# Patient Record
Sex: Female | Born: 1965 | Race: White | Hispanic: No | Smoking: Never smoker
Health system: Southern US, Community
[De-identification: ages and names within clinical notes are randomized; demographics above are authoritative.]

## PROBLEM LIST (undated history)

## (undated) DIAGNOSIS — L309 Dermatitis, unspecified: Secondary | ICD-10-CM

## (undated) DIAGNOSIS — F419 Anxiety disorder, unspecified: Secondary | ICD-10-CM

## (undated) DIAGNOSIS — E119 Type 2 diabetes mellitus without complications: Secondary | ICD-10-CM

## (undated) DIAGNOSIS — IMO0002 Reserved for concepts with insufficient information to code with codable children: Secondary | ICD-10-CM

## (undated) DIAGNOSIS — M797 Fibromyalgia: Secondary | ICD-10-CM

## (undated) DIAGNOSIS — M1990 Unspecified osteoarthritis, unspecified site: Secondary | ICD-10-CM

## (undated) DIAGNOSIS — M719 Bursopathy, unspecified: Secondary | ICD-10-CM

## (undated) DIAGNOSIS — F319 Bipolar disorder, unspecified: Secondary | ICD-10-CM

## (undated) DIAGNOSIS — G8929 Other chronic pain: Secondary | ICD-10-CM

## (undated) DIAGNOSIS — F4310 Post-traumatic stress disorder, unspecified: Secondary | ICD-10-CM

## (undated) HISTORY — PX: CERVICAL SPINE SURGERY: SHX589

## (undated) HISTORY — PX: BACK SURGERY: SHX140

## (undated) HISTORY — DX: Dermatitis, unspecified: L30.9

## (undated) HISTORY — DX: Type 2 diabetes mellitus without complications: E11.9

## (undated) HISTORY — PX: TONSILLECTOMY: SUR1361

## (undated) HISTORY — PX: HERNIA REPAIR: SHX51

---

## 2004-02-02 ENCOUNTER — Inpatient Hospital Stay (HOSPITAL_COMMUNITY): Admission: EM | Admit: 2004-02-02 | Discharge: 2004-02-06 | Payer: Self-pay | Admitting: Psychiatry

## 2004-06-15 ENCOUNTER — Emergency Department (HOSPITAL_COMMUNITY): Admission: EM | Admit: 2004-06-15 | Discharge: 2004-06-15 | Payer: Self-pay | Admitting: Emergency Medicine

## 2004-07-07 ENCOUNTER — Emergency Department (HOSPITAL_COMMUNITY): Admission: EM | Admit: 2004-07-07 | Discharge: 2004-07-07 | Payer: Self-pay | Admitting: Emergency Medicine

## 2008-10-18 ENCOUNTER — Encounter: Admission: RE | Admit: 2008-10-18 | Discharge: 2008-10-18 | Payer: Self-pay | Admitting: Family Medicine

## 2008-11-08 ENCOUNTER — Encounter
Admission: RE | Admit: 2008-11-08 | Discharge: 2009-02-06 | Payer: Self-pay | Admitting: Physical Medicine & Rehabilitation

## 2008-11-09 ENCOUNTER — Ambulatory Visit: Payer: Self-pay | Admitting: Physical Medicine & Rehabilitation

## 2008-12-28 ENCOUNTER — Ambulatory Visit (HOSPITAL_COMMUNITY): Admission: RE | Admit: 2008-12-28 | Discharge: 2008-12-28 | Payer: Self-pay | Admitting: General Surgery

## 2009-05-20 ENCOUNTER — Emergency Department (HOSPITAL_COMMUNITY): Admission: EM | Admit: 2009-05-20 | Discharge: 2009-05-20 | Payer: Self-pay | Admitting: Emergency Medicine

## 2009-10-09 ENCOUNTER — Ambulatory Visit: Payer: Self-pay | Admitting: Psychiatry

## 2009-10-09 ENCOUNTER — Inpatient Hospital Stay (HOSPITAL_COMMUNITY): Admission: AD | Admit: 2009-10-09 | Discharge: 2009-10-10 | Payer: Self-pay | Admitting: Psychiatry

## 2009-10-09 ENCOUNTER — Emergency Department (HOSPITAL_COMMUNITY): Admission: EM | Admit: 2009-10-09 | Discharge: 2009-10-09 | Payer: Self-pay | Admitting: Emergency Medicine

## 2010-06-18 ENCOUNTER — Emergency Department (HOSPITAL_COMMUNITY): Admission: EM | Admit: 2010-06-18 | Discharge: 2010-06-18 | Payer: Self-pay | Admitting: Emergency Medicine

## 2010-12-12 ENCOUNTER — Emergency Department (HOSPITAL_COMMUNITY)
Admission: EM | Admit: 2010-12-12 | Discharge: 2010-12-13 | Disposition: A | Discharge status: Home / Self Care | Payer: Self-pay | Attending: Emergency Medicine | Admitting: Emergency Medicine

## 2010-12-12 DIAGNOSIS — G8929 Other chronic pain: Secondary | ICD-10-CM | POA: Insufficient documentation

## 2010-12-12 DIAGNOSIS — F17200 Nicotine dependence, unspecified, uncomplicated: Secondary | ICD-10-CM | POA: Insufficient documentation

## 2010-12-12 DIAGNOSIS — I10 Essential (primary) hypertension: Secondary | ICD-10-CM | POA: Insufficient documentation

## 2010-12-12 DIAGNOSIS — M545 Low back pain, unspecified: Secondary | ICD-10-CM | POA: Insufficient documentation

## 2010-12-20 ENCOUNTER — Inpatient Hospital Stay (INDEPENDENT_AMBULATORY_CARE_PROVIDER_SITE_OTHER)
Admission: RE | Admit: 2010-12-20 | Discharge: 2010-12-20 | Disposition: A | Discharge status: Home / Self Care | Payer: Self-pay | Source: Ambulatory Visit | Attending: Family Medicine | Admitting: Family Medicine

## 2010-12-20 DIAGNOSIS — R6889 Other general symptoms and signs: Secondary | ICD-10-CM

## 2010-12-20 DIAGNOSIS — F411 Generalized anxiety disorder: Secondary | ICD-10-CM

## 2011-01-15 LAB — DIFFERENTIAL
Basophils Relative: 1 % (ref 0–1)
Neutro Abs: 11 10*3/uL — ABNORMAL HIGH (ref 1.7–7.7)
Neutrophils Relative %: 66 % (ref 43–77)

## 2011-01-15 LAB — POCT PREGNANCY, URINE: Preg Test, Ur: NEGATIVE

## 2011-01-15 LAB — BASIC METABOLIC PANEL
CO2: 26 mEq/L (ref 19–32)
Chloride: 106 mEq/L (ref 96–112)
GFR calc Af Amer: >60 mL/min (ref >60)
Glucose, Bld: 98 mg/dL (ref 70–99)
Potassium: 3.6 mEq/L (ref 3.5–5.1)
Sodium: 141 mEq/L (ref 135–145)

## 2011-01-15 LAB — CBC
Hemoglobin: 13.5 g/dL (ref 12.0–15.0)
MCHC: 33.8 g/dL (ref 30.0–36.0)
MCV: 93.6 fL (ref 78.0–100.0)
Platelets: 330 10*3/uL (ref 150–400)
RDW: 13.6 % (ref 11.5–15.5)
WBC: 16.7 10*3/uL — ABNORMAL HIGH (ref 4.0–10.5)

## 2011-01-15 LAB — URINALYSIS, ROUTINE W REFLEX MICROSCOPIC
Glucose, UA: NEGATIVE mg/dL
Hgb urine dipstick: NEGATIVE
Ketones, ur: NEGATIVE mg/dL
Nitrite: NEGATIVE
Urobilinogen, UA: 0.2 mg/dL (ref 0.0–1.0)

## 2011-01-15 LAB — RAPID URINE DRUG SCREEN, HOSP PERFORMED
Amphetamines: NOT DETECTED
Barbiturates: NOT DETECTED
Benzodiazepines: POSITIVE — AB
Opiates: NOT DETECTED
Tetrahydrocannabinol: NOT DETECTED

## 2011-01-25 LAB — URINALYSIS, ROUTINE W REFLEX MICROSCOPIC
Ketones, ur: NEGATIVE mg/dL
Leukocytes, UA: NEGATIVE
Urobilinogen, UA: 0.2 mg/dL (ref 0.0–1.0)
pH: 6 (ref 5.0–8.0)

## 2011-01-25 LAB — CBC
Hemoglobin: 12.8 g/dL (ref 12.0–15.0)
MCHC: 33.9 g/dL (ref 30.0–36.0)
MCV: 92.1 fL (ref 78.0–100.0)
Platelets: 339 10*3/uL (ref 150–400)
RBC: 4.1 MIL/uL (ref 3.87–5.11)

## 2011-01-25 LAB — DIFFERENTIAL
Basophils Absolute: 0.1 10*3/uL (ref 0.0–0.1)
Eosinophils Absolute: 0.2 10*3/uL (ref 0.0–0.7)
Eosinophils Relative: 2 % (ref 0–5)
Lymphs Abs: 2.3 10*3/uL (ref 0.7–4.0)
Monocytes Absolute: 0.6 10*3/uL (ref 0.1–1.0)
Neutrophils Relative %: 66 % (ref 43–77)

## 2011-01-25 LAB — URINE MICROSCOPIC-ADD ON

## 2011-01-25 LAB — PREGNANCY, URINE: Preg Test, Ur: NEGATIVE

## 2011-02-27 NOTE — Op Note (Signed)
[name], [name]                 ACCOUNT NO.:  [id number]   MEDICAL RECORD NO.:  [id number]          PATIENT TYPE:  AMB   LOCATION:  DAY                          FACILITY:  [facility]   PHYSICIAN:  Sharlet Salina T. Hoxworth, M.D.DATE OF BIRTH:  06-27-1966   DATE OF PROCEDURE:  12/28/2008  DATE OF DISCHARGE:                               OPERATIVE REPORT   POSTOPERATIVE DIAGNOSIS:  Umbilical hernia   POSTOPERATIVE DIAGNOSIS:  Umbilical hernia   SURGICAL PROCEDURE:  Repair of umbilical hernia with mesh.   SURGEON:  Dr. Johna Sheriff.   ANESTHESIA:  General.   BRIEF HISTORY:  [name] is a 45 year old female who presents with a  gradually enlarging and painful hernia at the umbilicus.  This was  confirmed on exam.  I have recommend proceeding with repair with mesh.  The nature of the procedure, indications, risks of anesthetic  complications, bleeding, infection and recurrence were discussed and  understood.  She is now brought to the operating room for this  procedure.   DESCRIPTION OF OPERATION:  The patient was brought to the operating room  placed and placed in a supine position on the operating and table and  general anesthesia was induced.  The abdomen was widely sterilely  prepped and draped.  She received preoperative IV antibiotics.  PAS were  in place.  The correct patient and procedure were verified.  I then made  a curvilinear incision just beneath the umbilicus and dissection was  carried down through the subcutaneous tissue.  Herniated preperitoneal  fat and hernia sac were dissected away from surrounding tissue and off  the umbilical skin and completely freed down to the level of the fascia.  The preperitoneal fat and hernia sac were then excised at the level of  the fascia with cautery with complete hemostasis.  The defect measured  about 1.5-2 cm in diameter.  I used a medium Proceed ventral patch which  was coiled and introduced into the abdominal cavity and then pulled  up  against the abdominal wall, and I carefully checked that it was nicely  flat and deployed in all directions.  The tags were then sutured to the  fascial edges superiorly and inferiorly with interrupted Prolene.  This  appeared to provide nice broad secured coverage.  The soft tissue was  then infiltrated with Marcaine.  The subcu was closed with interrupted  Monocryl, tacking the umbilical skin back down to the deep subcu and  then the skin was closed with subcuticular Monocryl and Dermabond.  Sponge, needle and instrument counts were correct.  The patient was  taken to recovery in good condition.      Lorne Skeens. Hoxworth, M.D.  Electronically Signed     BTH/MEDQ  D:  12/28/2008  T:  12/28/2008  Job:  [id number]
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
## 2011-03-02 NOTE — H&P (Signed)
Erin Kidd, FULP NO.:  [id number]   MEDICAL RECORD NO.:  [id number]                   PATIENT TYPE:  IPS   LOCATION:  0304                                 [facility]ACILITY[facility]  [facility]   PHYSICIAN:  Geoffery Lyons, M.D.                   DATE OF BIRTH:  [birth year]   DATE OF ADMISSION:  02/02/2004  DATE OF DISCHARGE:                         PSYCHIATRIC ADMISSION ASSESSMENT   IDENTIFYING INFORMATION:  This is a 45 year old divorced white female  voluntarily admitted on February 02, 2004.   HISTORY OF PRESENT ILLNESS:  The patient presents with a history of  intentional overdose, taking approximately 15 10-mg Valium tablets while she  was at home.  She states her intention was to kill herself.  The patient  states she is feeling very overwhelmed.  Her daughter is driving her  crazy.  Has run away, this is her second time and her intention was to kill  herself.  The patient states that she did call her son after she overdosed  and told him that she took the pills.  He did call EMS and the patient was  transported to the emergency department.  She reports her sleeping has been  decreased.  Her appetite has been decreased.  She denies any current  suicidal ideation or homicidal ideation.  The patient states that if she  killed herself, that her daughter would throw a party.  She has been  noncompliant with her medications for the past two days.  She states she  does not need to be dependent on medications.  Denies any alcohol or drug  use.   PAST PSYCHIATRIC HISTORY:  First admission to [facility].  No  other psychiatric admissions.  No history of a suicide attempt.  Has a  history of bipolar disorder.  Sees Dr. Evelene Croon as an outpatient.   SOCIAL HISTORY:  This is a 45 year old divorced white female.  She has a 76-  year-old daughter and a 13 year old son.  She states her son is to be  married soon.  Her 47 year old is currently with her 60 year old son.   She  normally lives with this child.  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  Completed ninth grade, no legal problems and states she  has little support from her family.   FAMILY HISTORY:  Mother had problems with alcohol and bipolar disorder, who  is now deceased, died in December 08, 2002.   ALCOHOL/DRUG HISTORY:  The patient smokes a few cigarettes per day.  No  alcohol or drug use.   PRIMARY CARE PHYSICIAN:  Dr. Kathrynn Running at [facility] in [place].   MEDICAL PROBLEMS:  None.   MEDICATIONS:  Has been on Valium and Lamictal, prescribed by Dr. Evelene Croon.  She  states she has been on the Lamictal for approximately two months.  Stopped  two days ago because she  was trying not to get dependent on her medicines.   ALLERGIES:  CODEINE.  The patient reports episodes of itching.   PHYSICAL EXAMINATION:  Done at [facility].  The patient  appears to be in no acute distress today.  Vital signs are stable at  temperature 97.7, heart rate 80, respirations 20, blood pressure 139/68.  She is 5 feet 4-1/2 inches tall.  She is 219 pounds.  Acetaminophen level  less than 10.  Alcohol level less than 5.  Salicylate level less than 4.  Urine pregnancy test was negative.   MENTAL STATUS EXAM:  The patient is sleepy.  She is in bed.  She is  cooperative.  Fair eye contact.  Mood is depressed.  Affect is flat.  Thought processes are coherent.  There appears to be no evidence of  psychosis.  Cognitive function intact.  Memory is fair.  Judgment is poor.  Insight is poor.   DIAGNOSES:   AXIS I:  Bipolar disorder, depressed.   AXIS II:  Deferred.   AXIS III:  None.   AXIS IV:  Problems with primary support group, other psychosocial problems.   AXIS V:  Current 25; this past year estimated 60-65.   PLAN:  Admission for intentional overdose.  Contract for safety.  Will have  Librium available for withdrawal symptoms from overdose of Valium.  The  patient  is to increase coping skills.  The patient is to attend groups.  The  patient is to be medication compliant.  The patient is to follow up with Dr.  Evelene Croon and may recommend sessions with client and her daughter.   TENTATIVE LENGTH OF STAY:  Four to five days.     Landry Corporal, N.P.                       Geoffery Lyons, M.D.    JO/MEDQ  D:  02/02/2004  T:  02/02/2004  Job:  [id number]

## 2011-03-02 NOTE — Discharge Summary (Signed)
NAMEVINCENTA, Erin Kidd NO.:  [id number]   MEDICAL RECORD NO.:  [id number]                   PATIENT TYPE:  IPS   LOCATION:  0304                                 [facility]ACILITY[facility]  [facility]   PHYSICIAN:  Geoffery Lyons, M.D.                   DATE OF BIRTH:  04-05-1966   DATE OF ADMISSION:  02/02/2004  DATE OF DISCHARGE:  02/06/2004                                 DISCHARGE SUMMARY   CHIEF COMPLAINT AND PRESENT ILLNESS:  This was the first admission to [facility] for this 45 year old divorced white female  voluntarily admitted.  She had a history of intentional overdose, taking  fifteen 10 mg Valium tablets while she was at home.  She claimed the  intention was to kill herself.  She claimed she was feeling overwhelmed.  Her daughter was driving her crazy.  She had run away; this was her second  time and her intention was to kill herself.  She called her son after she  overdosed and told him that she took the pills.  He called EMS.  She  endorsed decreased sleep, decreased appetite, suicidal ideation although she  denied any current suicidal or homicidal ideation at the time of this  evaluation.  She stated that if she had killed herself, her daughter would  have thrown a party.  She was noncompliant with the medications over the  last two years.   PAST PSYCHIATRIC HISTORY:  This was the first time at [facility].  No other psychiatric admissions.  She saw Milagros Evener, M.D., on  an outpatient basis.   SUBSTANCE ABUSE HISTORY:  She denied the use or abuse of any substances.   PAST MEDICAL HISTORY:  Noncontributory.   MEDICATIONS:  She had been on Valium and Lamictal prescribed by Milagros Evener, M.D.; Lamictal for two months.  She stopped two days prior to this  admission because she did not want to get dependent on the medication.   PHYSICAL EXAMINATION:  Physical examination was performed, failed to show  any acute  findings.   LABORATORY DATA:  Blood chemistries were within normal limits.  Thyroid  profile was within normal limits.   MENTAL STATUS EXAM:  Mental status exam revealed an alert, cooperative  female, somewhat sleepy, in bed, fair eye contact.  Mood was depressed.  Affect was constricted.  Thought processes were coherent.  There appeared to  be no evidence of psychosis, no delusions, no hallucinations.  She denied  any active suicidal ideation.  Cognitive: Cognition was well preserved.   ADMISSION DIAGNOSES:   AXIS I:  Major depression versus bipolar, depressed.   AXIS II:  No diagnosis.   AXIS III:  No diagnosis.   AXIS IV:  Moderate.   AXIS V:  Global assessment of functioning upon admission 25, highest global  assessment of functioning in the  last year 35-65.   HOSPITAL COURSE:  She was admitted and started in intensive individual and  group psychotherapy.  She was given Librium as needed.  She was maintained  on Lexapro.  She was given Lexapro 5 mg per day.  She was placed on Seroquel  50 mg twice a day and 100 mg at night and Lamictal 100 mg daily that she was  taking before.  She was also given Ambien 10 mg as needed for sleep.  She  endorsed multiple stressors, especially interaction with her 59 year old  daughter.  She felt overwhelmed, feeling that she could lose it.  She was  wanting to have a family session with her son who is 24 years old.  There  was a family session with her son and daughter and daughter-in-law.  The  conflict was evident but there was a willingness to deal with their issues.  Apparently, Child Protective Services was called and the CPS worker met with  her on the unit.  She got upset when she learned that this happened and  evidenced some increased anxiety and depression, tearfulness, but she was  able to handle it very well.  Overall, she felt much better.  She felt that  she had dealt with the stress, had developed some different coping  skills.  She felt that overall, she could be safe to be discharged.  On April 24, she  was in full contact with reality.  There were no suicidal ideas, no  homicidal ideas, no hallucinations, no delusions. She was willing and  motivated to pursue further outpatient treatment.  As it was assessed that  she had obtained full benefit from the hospitalization, we went ahead and  discharged to outpatient followup.   DISCHARGE DIAGNOSES:   AXIS I:  Major depression.   AXIS II:  No diagnosis.   AXIS III:  No diagnosis.   AXIS IV:  Moderate.   AXIS V:  Global assessment of functioning upon discharge 55-60.   DISCHARGE MEDICATIONS:  1. Lexapro 10 mg per day.  2. Lamictal 100 mg daily.  3. Seroquel 50 mg three times a day and 150 mg at night.  4. Claritin-D 10 mg daily.  5. Ambien 10 mg at bedtime for sleep.   FOLLOW UP:  She was to follow up with Milagros Evener, M.D.                                               Geoffery Lyons, M.D.    IL/MEDQ  D:  03/01/2004  T:  03/02/2004  Job:  [id number]

## 2011-07-04 ENCOUNTER — Ambulatory Visit (INDEPENDENT_AMBULATORY_CARE_PROVIDER_SITE_OTHER): Payer: Self-pay | Admitting: Surgery

## 2011-07-23 ENCOUNTER — Encounter (INDEPENDENT_AMBULATORY_CARE_PROVIDER_SITE_OTHER): Payer: Self-pay | Admitting: Surgery

## 2012-06-26 ENCOUNTER — Encounter (HOSPITAL_COMMUNITY): Payer: Self-pay | Admitting: *Deleted

## 2012-06-26 ENCOUNTER — Emergency Department (HOSPITAL_COMMUNITY): Payer: Self-pay

## 2012-06-26 ENCOUNTER — Inpatient Hospital Stay (HOSPITAL_COMMUNITY)
Admission: EM | Admit: 2012-06-26 | Discharge: 2012-07-01 | DRG: 871 | Disposition: A | Discharge status: Home / Self Care | Payer: MEDICAID | Attending: Pulmonary Disease | Admitting: Pulmonary Disease

## 2012-06-26 ENCOUNTER — Inpatient Hospital Stay (HOSPITAL_COMMUNITY): Payer: Self-pay

## 2012-06-26 DIAGNOSIS — J189 Pneumonia, unspecified organism: Secondary | ICD-10-CM | POA: Diagnosis present

## 2012-06-26 DIAGNOSIS — J811 Chronic pulmonary edema: Secondary | ICD-10-CM

## 2012-06-26 DIAGNOSIS — A419 Sepsis, unspecified organism: Principal | ICD-10-CM | POA: Diagnosis present

## 2012-06-26 DIAGNOSIS — I498 Other specified cardiac arrhythmias: Secondary | ICD-10-CM | POA: Diagnosis present

## 2012-06-26 DIAGNOSIS — J9601 Acute respiratory failure with hypoxia: Secondary | ICD-10-CM | POA: Diagnosis present

## 2012-06-26 DIAGNOSIS — R0902 Hypoxemia: Secondary | ICD-10-CM | POA: Diagnosis present

## 2012-06-26 DIAGNOSIS — I471 Supraventricular tachycardia: Secondary | ICD-10-CM | POA: Diagnosis present

## 2012-06-26 DIAGNOSIS — J9600 Acute respiratory failure, unspecified whether with hypoxia or hypercapnia: Secondary | ICD-10-CM | POA: Diagnosis present

## 2012-06-26 HISTORY — DX: Reserved for concepts with insufficient information to code with codable children: IMO0002

## 2012-06-26 HISTORY — DX: Fibromyalgia: M79.7

## 2012-06-26 HISTORY — DX: Bipolar disorder, unspecified: F31.9

## 2012-06-26 HISTORY — DX: Unspecified osteoarthritis, unspecified site: M19.90

## 2012-06-26 LAB — URINALYSIS, ROUTINE W REFLEX MICROSCOPIC
Glucose, UA: NEGATIVE mg/dL
Ketones, ur: NEGATIVE mg/dL
Protein, ur: >300 mg/dL — AB

## 2012-06-26 LAB — CBC
HCT: 39.7 % (ref 36.0–46.0)
HCT: 32.2 % — ABNORMAL LOW (ref 36.0–46.0)
Hemoglobin: 10.4 g/dL — ABNORMAL LOW (ref 12.0–15.0)
MCH: 27 pg (ref 26.0–34.0)
Platelets: 453 10*3/uL — ABNORMAL HIGH (ref 150–400)
RBC: 4.69 MIL/uL (ref 3.87–5.11)
RBC: 3.85 MIL/uL — ABNORMAL LOW (ref 3.87–5.11)
RDW: 16.9 % — ABNORMAL HIGH (ref 11.5–15.5)
WBC: 14 10*3/uL — ABNORMAL HIGH (ref 4.0–10.5)

## 2012-06-26 LAB — COMPREHENSIVE METABOLIC PANEL
AST: 56 U/L — ABNORMAL HIGH (ref 0–37)
AST: 29 U/L (ref 0–37)
Albumin: 3.1 g/dL — ABNORMAL LOW (ref 3.5–5.2)
Albumin: 2.5 g/dL — ABNORMAL LOW (ref 3.5–5.2)
Alkaline Phosphatase: 133 U/L — ABNORMAL HIGH (ref 39–117)
BUN: 7 mg/dL (ref 6–23)
CO2: 27 mEq/L (ref 19–32)
Chloride: 100 mEq/L (ref 96–112)
Creatinine, Ser: 0.4 mg/dL — ABNORMAL LOW (ref 0.50–1.10)
Potassium: 3.6 mEq/L (ref 3.5–5.1)
Total Bilirubin: 0.2 mg/dL — ABNORMAL LOW (ref 0.3–1.2)
Total Protein: 6.8 g/dL (ref 6.0–8.3)

## 2012-06-26 LAB — POCT I-STAT, CHEM 8
BUN: 9 mg/dL (ref 6–23)
Calcium, Ion: 1.12 mmol/L (ref 1.12–1.23)
Creatinine, Ser: 0.7 mg/dL (ref 0.50–1.10)
Glucose, Bld: 244 mg/dL — ABNORMAL HIGH (ref 70–99)
TCO2: 25 mmol/L (ref 0–100)

## 2012-06-26 LAB — APTT: aPTT: 26 seconds (ref 24–37)

## 2012-06-26 LAB — TROPONIN I
Troponin I: <0.3 ng/mL (ref <0.30)
Troponin I: <0.3 ng/mL (ref <0.30)

## 2012-06-26 LAB — URINE MICROSCOPIC-ADD ON

## 2012-06-26 LAB — PHOSPHORUS: Phosphorus: 2.3 mg/dL (ref 2.3–4.6)

## 2012-06-26 LAB — LIPASE, BLOOD: Lipase: 11 U/L (ref 11–59)

## 2012-06-26 LAB — STREP PNEUMONIAE URINARY ANTIGEN: Strep Pneumo Urinary Antigen: NEGATIVE

## 2012-06-26 LAB — MRSA PCR SCREENING: MRSA by PCR: NEGATIVE

## 2012-06-26 LAB — PROTIME-INR: INR: 1.02 (ref 0.00–1.49)

## 2012-06-26 LAB — EXPECTORATED SPUTUM ASSESSMENT W GRAM STAIN, RFLX TO RESP C

## 2012-06-26 LAB — POCT I-STAT TROPONIN I

## 2012-06-26 LAB — MAGNESIUM: Magnesium: 1.2 mg/dL — ABNORMAL LOW (ref 1.5–2.5)

## 2012-06-26 LAB — INFLUENZA PANEL BY PCR (TYPE A & B): Influenza A By PCR: NEGATIVE

## 2012-06-26 MED ORDER — LEVOFLOXACIN IN D5W 750 MG/150ML IV SOLN
750.0000 mg | INTRAVENOUS | Status: DC
  Administered 2012-06-26 – 2012-06-29 (×4): 750 mg via INTRAVENOUS
  Filled 2012-06-26 (×5): qty 150

## 2012-06-26 MED ORDER — METOPROLOL TARTRATE 1 MG/ML IV SOLN
5.0000 mg | Freq: Once | INTRAVENOUS | Status: AC
  Administered 2012-06-26: 5 mg via INTRAVENOUS

## 2012-06-26 MED ORDER — CHLORHEXIDINE GLUCONATE 0.12 % MT SOLN
15.0000 mL | Freq: Two times a day (BID) | OROMUCOSAL | Status: DC
  Administered 2012-06-26 – 2012-06-28 (×4): 15 mL via OROMUCOSAL
  Filled 2012-06-26 (×5): qty 15

## 2012-06-26 MED ORDER — DILTIAZEM HCL 100 MG IV SOLR
5.0000 mg/h | INTRAVENOUS | Status: DC
  Administered 2012-06-26: 10 mg/h via INTRAVENOUS

## 2012-06-26 MED ORDER — SODIUM CHLORIDE 0.9 % IV SOLN
20.0000 mL | INTRAVENOUS | Status: DC
  Administered 2012-06-26: 20 mL via INTRAVENOUS

## 2012-06-26 MED ORDER — VANCOMYCIN HCL IN DEXTROSE 1-5 GM/200ML-% IV SOLN
1000.0000 mg | Freq: Once | INTRAVENOUS | Status: AC
  Administered 2012-06-26: 1000 mg via INTRAVENOUS
  Filled 2012-06-26: qty 200

## 2012-06-26 MED ORDER — CHLORHEXIDINE GLUCONATE 0.12 % MT SOLN
OROMUCOSAL | Status: AC
  Administered 2012-06-26: 15 mL via OROMUCOSAL
  Filled 2012-06-26: qty 15

## 2012-06-26 MED ORDER — BIOTENE DRY MOUTH MT LIQD
15.0000 mL | Freq: Two times a day (BID) | OROMUCOSAL | Status: DC
  Administered 2012-06-27 (×2): 15 mL via OROMUCOSAL

## 2012-06-26 MED ORDER — SODIUM CHLORIDE 0.9 % IV SOLN
INTRAVENOUS | Status: DC
  Administered 2012-06-26: 75 mL/h via INTRAVENOUS

## 2012-06-26 MED ORDER — HEPARIN SODIUM (PORCINE) 5000 UNIT/ML IJ SOLN
5000.0000 [IU] | Freq: Three times a day (TID) | INTRAMUSCULAR | Status: DC
  Administered 2012-06-26 – 2012-07-01 (×15): 5000 [IU] via SUBCUTANEOUS
  Filled 2012-06-26 (×18): qty 1

## 2012-06-26 MED ORDER — ADENOSINE 6 MG/2ML IV SOLN
6.0000 mg | Freq: Once | INTRAVENOUS | Status: AC
  Administered 2012-06-26: 6 mg via INTRAVENOUS

## 2012-06-26 MED ORDER — VANCOMYCIN HCL IN DEXTROSE 1-5 GM/200ML-% IV SOLN
1000.0000 mg | Freq: Two times a day (BID) | INTRAVENOUS | Status: DC
  Administered 2012-06-27 – 2012-06-29 (×6): 1000 mg via INTRAVENOUS
  Filled 2012-06-26 (×6): qty 200

## 2012-06-26 MED ORDER — DEXTROSE 5 % IV SOLN
1.0000 g | INTRAVENOUS | Status: DC
  Administered 2012-06-26 – 2012-06-29 (×4): 1 g via INTRAVENOUS
  Filled 2012-06-26 (×5): qty 10

## 2012-06-26 MED ORDER — INFLUENZA VIRUS VACC SPLIT PF IM SUSP
0.5000 mL | INTRAMUSCULAR | Status: AC
  Administered 2012-06-27: 0.5 mL via INTRAMUSCULAR
  Filled 2012-06-26: qty 0.5

## 2012-06-26 MED ORDER — FUROSEMIDE 10 MG/ML IJ SOLN
80.0000 mg | Freq: Once | INTRAMUSCULAR | Status: AC
  Administered 2012-06-26: 80 mg via INTRAVENOUS
  Filled 2012-06-26: qty 8

## 2012-06-26 MED ORDER — METOPROLOL TARTRATE 1 MG/ML IV SOLN
INTRAVENOUS | Status: AC
  Administered 2012-06-26: 5 mg via INTRAVENOUS
  Filled 2012-06-26: qty 5

## 2012-06-26 MED ORDER — OSELTAMIVIR PHOSPHATE 75 MG PO CAPS
75.0000 mg | ORAL_CAPSULE | Freq: Two times a day (BID) | ORAL | Status: DC
  Administered 2012-06-26 – 2012-06-27 (×3): 75 mg via ORAL
  Filled 2012-06-26 (×4): qty 1

## 2012-06-26 MED ORDER — ADENOSINE 6 MG/2ML IV SOLN
INTRAVENOUS | Status: AC
  Administered 2012-06-26: 6 mg via INTRAVENOUS
  Filled 2012-06-26: qty 6

## 2012-06-26 MED ORDER — SODIUM CHLORIDE 0.9 % IV BOLUS (SEPSIS)
1000.0000 mL | Freq: Once | INTRAVENOUS | Status: AC
  Administered 2012-06-26: 1000 mL via INTRAVENOUS

## 2012-06-26 MED ORDER — FAMOTIDINE IN NACL 20-0.9 MG/50ML-% IV SOLN
20.0000 mg | Freq: Two times a day (BID) | INTRAVENOUS | Status: DC
  Administered 2012-06-26 – 2012-06-30 (×9): 20 mg via INTRAVENOUS
  Filled 2012-06-26 (×10): qty 50

## 2012-06-26 MED ORDER — PIPERACILLIN-TAZOBACTAM 3.375 G IVPB
3.3750 g | Freq: Once | INTRAVENOUS | Status: AC
  Administered 2012-06-26: 3.375 g via INTRAVENOUS
  Filled 2012-06-26: qty 50

## 2012-06-26 MED ORDER — POTASSIUM CHLORIDE 10 MEQ/50ML IV SOLN
10.0000 meq | INTRAVENOUS | Status: AC
  Administered 2012-06-26 – 2012-06-27 (×6): 10 meq via INTRAVENOUS
  Filled 2012-06-26 (×6): qty 50

## 2012-06-26 MED ORDER — MAGNESIUM SULFATE 40 MG/ML IJ SOLN
4.0000 g | Freq: Once | INTRAMUSCULAR | Status: AC
  Administered 2012-06-26: 4 g via INTRAVENOUS
  Filled 2012-06-26: qty 100

## 2012-06-26 NOTE — Progress Notes (Signed)
ANTIBIOTIC CONSULT NOTE - INITIAL  Pharmacy Consult for Vancomycin/Ceftriaxone/Levaquin Indication: pneumonia  No Known Allergies  Patient Measurements: Height: 5\' 4"  (162.6 cm) Weight: 255 lb 1.2 oz (115.7 kg) IBW/kg (Calculated) : 54.7   Vital Signs: Temp: 99.6 F (37.6 C) (09/12 1400) Temp src: Axillary (09/12 1400) BP: 130/65 mmHg (09/12 1400) Pulse Rate: 91  (09/12 1400) Intake/Output from previous day:   Intake/Output from this shift: Total I/O In: -  Out: 3150 [Urine:3150]  Labs:  [facility] 06/26/12 0827 06/26/12 0805  WBC -- 14.0*  HGB 14.3 12.9  PLT -- 453*  LABCREA -- --  CREATININE 0.70 0.50   Estimated Creatinine Clearance: 108.6 ml/min (by C-G formula based on Cr of 0.7).  Microbiology: No results found for this or any previous visit (from the past 720 hour(s)).  Medical History: Past Medical History  Diagnosis Date  . Arthritis   . Fibromyalgia   . Bipolar 1 disorder   . Herniated disc     Assessment: 1 YOF presents with respiratory symptoms (CAP vs influenza) and sepsis.  WBC elevated, Tmax 99.6. Starting vancomycin, ceftriaxone, and levaquin x 7 days and Tamiflu 75 mg BID x 5 days.  Patient already received 1g dose of vancomycin in ER. CrCl>100 ml/min.  Pt weight 115 kg, will use obese dosing for vancomycin.  Goal of Therapy:  Vancomycin trough level 15-20 mcg/ml  Plan:  Vancomycin 1g IV x 1 now (to make 2g total loading dose) then start 1g IV q12h.  Check VT at [facility]. Ceftriaxone and Levaquin doses appropriate. F/u SCr and culture results.  Clance Boll 06/26/2012,2:41 PM

## 2012-06-26 NOTE — ED Notes (Addendum)
Cough and cold symptoms x 3 days, chest hurting  HR 201 in triage

## 2012-06-26 NOTE — ED Notes (Signed)
Pt brought back from triage with elevated HR. Given adenosine 6mg  with Bernette Mayers, EDP and crash cart at bedside. HR decreased to 112. Pt reports some relief of chest pain. C/o sob. 90% on 2L O2, increased to 3L, up to 95%. Pt denies any past similar symptoms. Pt tearful, nervous. Pt reassured. Family member at bedside.

## 2012-06-26 NOTE — H&P (Signed)
Name: Erin Kidd MRN: [id number] DOB: [birth year]    LOS: 0  Referring Provider: shelton Reason for Referral:  Acute resp failure   PULMONARY / CRITICAL CARE MEDICINE  HPI:   This is a 46 year old female who presented to the ED at [facility] on 9/12 after about 6 day h/o progressive productive cough, and 3 day h/o progressive dyspnea. States had been taking care of her grandchildren who had URI symptoms ~ 1 week prior. She reports soon after that she developed sore throat, runny nose, nasal congestion and cough. She reports that her sputum has become more green in color, and cough more frequent. She presented to the ER w/ 3 days of progressive dyspnea that got to the point she was SOB at rest. On arrival to the ER she was found to be hypoxic w/ sats in 70s, and in SVT. She was given adenosine and converted back to NSR. She was placed on BIPAP for hypoxia w/ decreased WOB. Initial CXR shows bilateral airspace disease. She is to be admitted to the ICU for acute resp failure.   Past Medical History  Diagnosis Date  . Arthritis   . Fibromyalgia   . Bipolar 1 disorder   . Herniated disc    Past Surgical History  Procedure Date  . Back surgery   . Cervical spine surgery   . Hernia repair   . Cesarean section   . Tonsillectomy    Prior to Admission medications   Medication Sig Start Date End Date Taking? Authorizing Provider  cetirizine (ZYRTEC) 10 MG tablet Take 10 mg by mouth daily.   Yes Historical Provider, MD  FIBER, GUAR GUM, PO Take 1 tablet by mouth daily.   Yes Historical Provider, MD  Kandice Hams Chondr-MSM [phone] MG TABS Take 1 tablet by mouth daily.   Yes Historical Provider, MD  ibuprofen (ADVIL,MOTRIN) 200 MG tablet Take 600 mg by mouth every 6 (six) hours as needed. pain   Yes Historical Provider, MD  Multiple Vitamin (MULTIVITAMIN WITH MINERALS) TABS Take 1 tablet by mouth daily.   Yes Historical Provider, MD  Pseudoeph-Bromphen-DM (ROBITUSSIN ALLERGY/COUGH PO) Take 5  mLs by mouth at bedtime as needed. Cough   Yes Historical Provider, MD   Allergies No Known Allergies  Family History History reviewed. No pertinent family history. Social History  reports that she has never smoked. She has never used smokeless tobacco. She reports that she does not drink alcohol or use illicit drugs.  Review Of Systems:   Per HPI  Brief patient description:   46 year old female admitted on 9/12 w/ presumed CAP, resultant acute respiratory failure, SVT and sepsis.  Events Since Admission: More comfortable on bipap  Current Status: Critical  Vital Signs: Temp:  [97.5 F (36.4 C)-99 F (37.2 C)] 99 F (37.2 C) (09/12 0812) Pulse Rate:  [37-201] 37  (09/12 1000) Resp:  [20-40] 36  (09/12 1000) BP: (140-155)/(63-108) 151/63 mmHg (09/12 1000) SpO2:  [92 %-95 %] 92 % (09/12 1000)  Physical Examination: General:  Acutely ill appearing white female, currently WOB improved.  Neuro:  Awake, oriented X 3 HEENT:  [place], no JVD Cardiovascular:  Tachy rrr Lungs:  Scattered rhonchi, basilar rales  Abdomen:  Soft, non-tender  Musculoskeletal:  intact Skin:  Warm and flushed   Active Problems:  CAP (community acquired pneumonia)  Acute respiratory failure with hypoxia  SVT (supraventricular tachycardia)  Sepsis   ASSESSMENT AND PLAN  PULMONARY No results found for this basename: PHART:5,PCO2:5,PCO2ART:5,PO2ART:5,HCO3:5,O2SAT:5 in the  last 168 hours Ventilator Settings:   CXR:  Bibasilar airspace disease.  ETT:   A:  1) acute respiratory failure in setting of bilateral pulmonary infiltrates. Diff dx: cap w/ evolving ALI, worried about influenza, less likely edema. Worried that this may get worse before it gets better.  2) CAP vs influenza  P:   Admit to ICU  Trial NIPPV, but low threshold to intubate Scheduled BDs F/u abg See ID section  CARDIOVASCULAR No results found for this basename: TROPONINI:5,LATICACIDVEN:5, O2SATVEN:5,PROBNP:5 in the last 168  hours ECG:   Lines:   A: 1) sepsis 2) SVT converted after adenosine.  P:  Check lactate  IVFs  Sepsis protocol if lactate elevated   RENAL  Lab 06/26/12 0827 06/26/12 0805  NA 143 140  K 3.7 3.6  CL 105 100  CO2 -- 27  BUN 9 9  CREATININE 0.70 0.50  CALCIUM -- 9.2  MG -- --  PHOS -- --   Intake/Output    None    Foley:  9/12  A:  No acute issues, did get furosemide in ER worried about K.  P:   recheck chemistry this afternoon   GASTROINTESTINAL  Lab 06/26/12 0805  AST 56*  ALT 60*  ALKPHOS 133*  BILITOT 0.2*  PROT 8.1  ALBUMIN 3.1*    A:  Mild elevated LFTs. ? Due to hypotensions.  P:   Trend, f/u in am   HEMATOLOGIC  Lab 06/26/12 0827 06/26/12 0805  HGB 14.3 12.9  HCT 42.0 39.7  PLT -- 453*  INR -- --  APTT -- --   A:  No acute issues P:  Trend cbc   INFECTIOUS  Lab 06/26/12 0805  WBC 14.0*  PROCALCITON --   Cultures: Sputum 9/12>>> BCx 2 9/12>>> UC 9/12>>> Influenza 9/12>>> Urine strep 9/12>>> Urine legionella 9/12>>> Antibiotics: levaquin 9/12>>> Rocephin 9/12>>> Vanc 9/12>>> tamiflu 9/12>>>  A:   1) CAP r/o influenza 2) sepsis  P:   See above   ENDOCRINE No results found for this basename: GLUCAP:5 in the last 168 hours A:  No acute issue  P:   Check chemistry  NEUROLOGIC  A:  No acute issue  P:   Supportive care   BEST PRACTICE / DISPOSITION Level of Care:  ICU  Primary Service:  PCCM  Consultants:  None  Code Status:  Full  Diet: NPO  DVT Px:  [place] heparin  GI Px: H2 blocker  Skin Integrity:  Intact  Social / Family:  Updated   BABCOCK,PETE  06/26/2012, 11:50 AM  Will hold of intubation for now.  Flu test sent, tamiflu started, broad spectrum abx, BiPAP as needed.  If intubate then will need ARDS protocol.  Keep on the dry side.  F/U electrolytes.  CC time 45 min.  Patient seen and examined, agree with above note.  I dictated the care and orders written for this patient under my  direction.  Koren Bound, M.D. [phone]

## 2012-06-26 NOTE — Procedures (Signed)
Central Venous Catheter Insertion Procedure Note Erin Kidd [id number] [birth year]  Procedure: Insertion of Central Venous Catheter Indications: Assessment of intravascular volume and Drug and/or fluid administration  Procedure Details Consent: Risks of procedure as well as the alternatives and risks of each were explained to the (patient/caregiver).  Consent for procedure obtained. Time Out: Verified patient identification, verified procedure, site/side was marked, verified correct patient position, special equipment/implants available, medications/allergies/relevent history reviewed, required imaging and test results available.  Performed  Maximum sterile technique was used including antiseptics, cap, gloves, gown, hand hygiene, mask and sheet. Skin prep: Chlorhexidine; local anesthetic administered A antimicrobial bonded/coated triple lumen catheter was placed in the right internal jugular vein using the Seldinger technique.  Evaluation Blood flow good Complications: No apparent complications Patient did tolerate procedure well. Chest X-ray ordered to verify placement.  CXR: pending.  U/S used in placement, picture in chart.  Erin Kidd 06/26/2012, 3:44 PM

## 2012-06-26 NOTE — ED Provider Notes (Addendum)
History     CSN: [id number]  Arrival date & time 06/26/12  0865   First MD Initiated Contact with Patient 06/26/12 [id number]      Chief Complaint  Patient presents with  . Cough  . URI  . Chest Pain    (Consider location/radiation/quality/duration/timing/severity/associated sxs/prior treatment) HPI Pt reports URI symptoms for the last several days, runny nose, productive cough, general malaise, subjective fever. She reports she felt particularly bad this morning while in town visiting her daughter. Complaining of increased SOB, moderate aching midsternal chest pains. She denies any particular provoking or relieving factors. She reports she has taken several different OTC medications without improvement.  No past medical history on file.  No past surgical history on file.  No family history on file.  History  Substance Use Topics  . Smoking status: Not on file  . Smokeless tobacco: Not on file  . Alcohol Use: Not on file    OB History    No data available      Review of Systems All other systems reviewed and are negative except as noted in HPI.   Allergies  Review of patient's allergies indicates not on file.  Home Medications  No current outpatient prescriptions on file.  BP 140/108  Pulse 200  Temp 99 F (37.2 C) (Oral)  Resp 33  SpO2 92%  Physical Exam  Nursing note and vitals reviewed. Constitutional: She is oriented to person, place, and time. She appears well-developed and well-nourished.  HENT:  Head: Normocephalic and atraumatic.  Eyes: EOM are normal. Pupils are equal, round, and reactive to light.  Neck: Normal range of motion. Neck supple.  Cardiovascular:       Marked tachycardia  Pulmonary/Chest: Effort normal and breath sounds normal. She has no wheezes.       Productive cough  Abdominal: Bowel sounds are normal. She exhibits no distension. There is no tenderness.  Musculoskeletal: Normal range of motion. She exhibits no edema and no  tenderness.  Neurological: She is alert and oriented to person, place, and time. She has normal strength. No cranial nerve deficit or sensory deficit.  Skin: Skin is warm and dry. No rash noted.  Psychiatric: She has a normal mood and affect.    ED Course  Procedures (including critical care time)  Labs Reviewed  CBC - Abnormal; Notable for the following:    WBC 14.0 (*)     RDW 16.9 (*)     Platelets 453 (*)     All other components within normal limits  COMPREHENSIVE METABOLIC PANEL - Abnormal; Notable for the following:    Glucose, Bld 245 (*)     Albumin 3.1 (*)     AST 56 (*)     ALT 60 (*)     Alkaline Phosphatase 133 (*)     Total Bilirubin 0.2 (*)     All other components within normal limits  URINALYSIS, ROUTINE W REFLEX MICROSCOPIC - Abnormal; Notable for the following:    Color, Urine AMBER (*)  BIOCHEMICALS MAY BE AFFECTED BY COLOR   APPearance CLOUDY (*)     Specific Gravity, Urine 1.034 (*)     Hgb urine dipstick SMALL (*)     Bilirubin Urine SMALL (*)     Protein, ur >300 (*)     All other components within normal limits  POCT I-STAT, CHEM 8 - Abnormal; Notable for the following:    Glucose, Bld 244 (*)     All other components within  normal limits  URINE MICROSCOPIC-ADD ON - Abnormal; Notable for the following:    Casts HYALINE CASTS (*)     Crystals CA OXALATE CRYSTALS (*)     All other components within normal limits  POCT I-STAT TROPONIN I  URINE CULTURE   Dg Chest Portable 1 View  06/26/2012  *RADIOLOGY REPORT*  Clinical Data: Cough, productive cough, elevated white blood cell count  PORTABLE CHEST - 1 VIEW  Comparison: Chest radiograph 06/18/2010, 10/09/2009, CT 82,008  Findings: Compared to prior exams, there is increase central venous pulmonary congestion.  There is bibasilar ill-defined opacities with mild nodularity which also worsened.  The heart silhouette is stable.  Prominent right lower lobe pulmonary artery noted. Anterior cervical fusion  noted.  IMPRESSION: . 1.  Increased central pulmonary congestion suggest vascular overload. 2.  Bibasilar opacities with differential including infiltrates, bronchitis, or edema.  Recommend follow-up chest radiographs to ensure resolution.  Favor infection.  Findings discussed Dr. Bernette Mayers on 06/26/2012.   Original Report Authenticated By: Genevive Bi, M.D.      No diagnosis found.    MDM   Date: 06/26/2012 0809  Rate: 199  Rhythm: supraventricular tachycardia (SVT)  QRS Axis: normal  Intervals: indeterminate  ST/T Wave abnormalities: indeterminate  Conduction Disutrbances:SVT  Narrative Interpretation:   Old EKG Reviewed: none available   Date: 06/26/2012 0815 post adenosine  Rate: 119  Rhythm: sinus tachycardia  QRS Axis: normal  Intervals: normal  ST/T Wave abnormalities: nonspecific T wave changes  Conduction Disutrbances:none  Narrative Interpretation:   Old EKG Reviewed: SVT resolved  Pt with recent URI but no known cardiac disease but has been taking decongestants at home which may have caused SVT seen today. Will check labs, CXR, IVF and close monitoring in the ED.    Pt initially converted to Sinus tachy after one dose of adenosine but about later SVT returned. She was given a second dose of Adensine with conversion but reverted a few minutes later. She had some improvement with metoprolol IV but maintaining around 140 so Cardizem drip was initiated and now holding around 100. She has CXR as above concerning for infiltrates vs edema. Pt's SpO2 has been tending down to mid 80s despite increasing her [place] oxygen. Wil begin BiPAP, given Lasix for fluid overload, Vanc/Zosyn for CAP admission to ICU. Discussed with PCCM who will evaluate the patient for admission.   CRITICAL CARE Performed by: Pollyann Savoy   Total critical care time: 60  Critical care time was exclusive of separately billable procedures and treating other patients.  Critical care was  necessary to treat or prevent imminent or life-threatening deterioration.  Critical care was time spent personally by me on the following activities: development of treatment plan with patient and/or surrogate as well as nursing, discussions with consultants, evaluation of patient's response to treatment, examination of patient, obtaining history from patient or surrogate, ordering and performing treatments and interventions, ordering and review of laboratory studies, ordering and review of radiographic studies, pulse oximetry and re-evaluation of patient's condition.           Charles B. Bernette Mayers, MD 06/26/12 1032

## 2012-06-26 NOTE — Progress Notes (Signed)
CRITICAL VALUE ALERT  Critical value received:  Potassium 2.7  Date of notification:  9/12  Time of notification:  18:50  Critical value read back:yes  Nurse who received alert:  Elayne Snare, RN  MD notified (1st page):  Dr. Vassie Loll  Time of first page:  18:55  MD notified (2nd page):  Time of second page:  Responding MD:  Dr. Vassie Loll  Time MD responded:  18:55

## 2012-06-26 NOTE — Progress Notes (Signed)
eLink Physician-Brief Progress Note Patient Name: Erin Kidd DOB: 06-10-1966 MRN: [id number]  Date of Service  06/26/2012   HPI/Events of Note     eICU Interventions  Hypokalemia / hypomag -repleted    Intervention Category Major Interventions: Electrolyte abnormality - evaluation and management  ALVA,RAKESH V. 06/26/2012, 7:00 PM

## 2012-06-26 NOTE — ED Notes (Signed)
Per Cindee Lame, NP, blood cultures okay to draw anyway after abx have been started.

## 2012-06-26 NOTE — ED Notes (Addendum)
Pt's daughter out to desk to speak with RN. Daughter sts pt was not honest about her medications. Gave RN a list of meds that pt has at home, sts she does not know which ones are current, or what she takes on a daily basis. Sts that pt does not know that she has given RN this list and does not want pt to know where it came from as to not upset her. List placed on pt's chart for review if needed.

## 2012-06-27 ENCOUNTER — Inpatient Hospital Stay (HOSPITAL_COMMUNITY): Payer: Self-pay

## 2012-06-27 LAB — LEGIONELLA ANTIGEN, URINE

## 2012-06-27 LAB — BLOOD GAS, ARTERIAL
Bicarbonate: 29.5 mEq/L — ABNORMAL HIGH (ref 20.0–24.0)
Delivery systems: POSITIVE
Expiratory PAP: 5
FIO2: 0.4 %
Mode: POSITIVE
O2 Saturation: 94.8 %
Patient temperature: 98.6
TCO2: 26.9 mmol/L (ref 0–100)

## 2012-06-27 LAB — CBC
HCT: 32.2 % — ABNORMAL LOW (ref 36.0–46.0)
Hemoglobin: 10.1 g/dL — ABNORMAL LOW (ref 12.0–15.0)
MCH: 26.4 pg (ref 26.0–34.0)
MCHC: 31.4 g/dL (ref 30.0–36.0)

## 2012-06-27 LAB — COMPREHENSIVE METABOLIC PANEL
Alkaline Phosphatase: 106 U/L (ref 39–117)
BUN: 8 mg/dL (ref 6–23)
CO2: 31 mEq/L (ref 19–32)
Chloride: 101 mEq/L (ref 96–112)
GFR calc Af Amer: >90 mL/min (ref >90)
Glucose, Bld: 151 mg/dL — ABNORMAL HIGH (ref 70–99)
Potassium: 3.4 mEq/L — ABNORMAL LOW (ref 3.5–5.1)
Total Bilirubin: 0.3 mg/dL (ref 0.3–1.2)

## 2012-06-27 LAB — TROPONIN I: Troponin I: <0.3 ng/mL (ref <0.30)

## 2012-06-27 LAB — URINE CULTURE

## 2012-06-27 MED ORDER — PHENOL 1.4 % MT LIQD
1.0000 | OROMUCOSAL | Status: DC | PRN
  Administered 2012-06-28 (×3): 1 via OROMUCOSAL
  Filled 2012-06-27 (×2): qty 177

## 2012-06-27 MED ORDER — POTASSIUM CHLORIDE CRYS ER 20 MEQ PO TBCR
40.0000 meq | EXTENDED_RELEASE_TABLET | Freq: Three times a day (TID) | ORAL | Status: AC
  Administered 2012-06-27 (×2): 40 meq via ORAL
  Filled 2012-06-27 (×2): qty 2

## 2012-06-27 MED ORDER — ACETAMINOPHEN 325 MG PO TABS
ORAL_TABLET | ORAL | Status: AC
  Filled 2012-06-27: qty 2

## 2012-06-27 MED ORDER — K PHOS MONO-SOD PHOS DI & MONO 155-852-130 MG PO TABS
500.0000 mg | ORAL_TABLET | Freq: Two times a day (BID) | ORAL | Status: AC
  Administered 2012-06-27 – 2012-06-28 (×4): 500 mg via ORAL
  Filled 2012-06-27 (×4): qty 2

## 2012-06-27 MED ORDER — ACETAMINOPHEN 325 MG PO TABS
650.0000 mg | ORAL_TABLET | Freq: Four times a day (QID) | ORAL | Status: DC | PRN
  Administered 2012-06-27 – 2012-07-01 (×10): 650 mg via ORAL
  Filled 2012-06-27 (×9): qty 2

## 2012-06-27 MED ORDER — ALBUTEROL SULFATE HFA 108 (90 BASE) MCG/ACT IN AERS
2.0000 | INHALATION_SPRAY | RESPIRATORY_TRACT | Status: DC | PRN
  Administered 2012-06-28: 2 via RESPIRATORY_TRACT
  Filled 2012-06-27: qty 6.7

## 2012-06-27 MED ORDER — FUROSEMIDE 40 MG PO TABS
40.0000 mg | ORAL_TABLET | Freq: Once | ORAL | Status: AC
  Administered 2012-06-27: 40 mg via ORAL
  Filled 2012-06-27: qty 1

## 2012-06-27 NOTE — Progress Notes (Signed)
CARE MANAGEMENT NOTE 06/27/2012  Patient:  Erin Kidd, Erin Kidd   Account Number:  [id number]  Date Initiated:  06/27/2012  Documentation initiated by:  Kiyla Ringler  Subjective/Objective Assessment:   admitted ue to resp distress po2 70% poss pna, placed on bipap     Action/Plan:   lives at home, takes care of her grandchildren   Anticipated DC Date:  06/30/2012   Anticipated DC Plan:  HOME/SELF CARE  In-house referral  NA      DC Planning Services  NA      [facility] Choice  NA   Choice offered to / List presented to:  NA   DME arranged  NA      DME agency  NA     HH arranged  NA      HH agency  NA   Status of service:  In process, will continue to follow Medicare Important Message given?  NA - LOS <3 / Initial given by admissions (If response is "NO", the following Medicare IM given date fields will be blank) Date Medicare IM given:   Date Additional Medicare IM given:    Discharge Disposition:    Per UR Regulation:  Reviewed for med. necessity/level of care/duration of stay  If discussed at Long Length of Stay Meetings, dates discussed:    Comments:  [id number]/Teala Daffron Earlene Plater, RN, BSN, CCM: CHART REVIEWED AND UPDATED. NO DISCHARGE NEEDS PRESENT AT THIS TIME. CASE MANAGEMENT [phone]

## 2012-06-27 NOTE — Progress Notes (Signed)
Pt titrated to 2L nasal cannula.

## 2012-06-27 NOTE — Progress Notes (Signed)
Name: Erin Kidd MRN: [id number] DOB: May 19, 1966    LOS: 1  Referring Provider: shelton Reason for Referral:  Acute resp failure   PULMONARY / CRITICAL CARE MEDICINE  HPI:   This is a 46 year old female who presented to the ED at [facility] on 9/12 after about 6 day h/o progressive productive cough, and 3 day h/o progressive dyspnea. States had been taking care of her grandchildren who had URI symptoms ~ 1 week prior. She reports soon after that she developed sore throat, runny nose, nasal congestion and cough. She reports that her sputum has become more green in color, and cough more frequent. She presented to the ER w/ 3 days of progressive dyspnea that got to the point she was SOB at rest. On arrival to the ER she was found to be hypoxic w/ sats in 70s, and in SVT. She was given adenosine and converted back to NSR. She was placed on BIPAP for hypoxia w/ decreased WOB. Initial CXR shows bilateral airspace disease. She is to be admitted to the ICU for acute resp failure.   Brief patient description:   46 year old female admitted on 9/12 w/ presumed CAP, resultant acute respiratory failure, SVT and sepsis.   Events Since Admission: More comfortable on bipap  Current Status: Critical  Vital Signs: Temp:  [97.3 F (36.3 C)-100.4 F (38 C)] 98.8 F (37.1 C) (09/13 0800) Pulse Rate:  [77-94] 80  (09/13 0700) Resp:  [20-38] 23  (09/13 0700) BP: (86-149)/(35-81) 144/64 mmHg (09/13 0700) SpO2:  [94 %-100 %] 97 % (09/13 0700) FiO2 (%):  [50 %] 50 % (09/12 1600) Weight:  [115.7 kg (255 lb 1.2 oz)-116.1 kg (255 lb 15.3 oz)] 116.1 kg (255 lb 15.3 oz) (09/13 0100)  Physical Examination: General:  Acutely ill appearing white female, currently WOB improved.  Neuro:  Awake, oriented X 3 HEENT:  [place], no JVD Cardiovascular:  Tachy rrr Lungs:  Scattered rhonchi, basilar rales  Abdomen:  Soft, non-tender  Musculoskeletal:  intact Skin:  Warm and flushed   Active Problems:  CAP (community acquired  pneumonia)  Acute respiratory failure with hypoxia  SVT (supraventricular tachycardia)  Sepsis   ASSESSMENT AND PLAN  PULMONARY  Lab 06/27/12 0437  PHART 7.462*  PCO2ART 41.9  PO2ART 75.4*  HCO3 29.5*  O2SAT 94.8   Ventilator Settings: Vent Mode:  [-]  FiO2 (%):  [50 %] 50 % CXR:  Bibasilar airspace disease.  ETT: NIV 9/12>>>  A:  1) acute respiratory failure in setting of bilateral pulmonary infiltrates. Diff dx: cap w/ evolving ALI, worried about influenza, less likely edema. Worried that this may get worse before it gets better.  2) CAP vs influenza  P:   Remains on BiPAP, will trial off and change to PRN, doubt will be completely off today, if needed til AM will likely need intubation until ALI is improved. Scheduled BDs. See ID section. Gentle diureses.  CARDIOVASCULAR  Lab 06/27/12 0327 06/26/12 2136 06/26/12 1800  TROPONINI <0.30 <0.30 <0.30  LATICACIDVEN -- -- 1.5  PROBNP -- -- 783.2*   ECG:   Lines:   A: 1) sepsis 2) SVT converted after adenosine.  P:  KVO IVFs   RENAL  Lab 06/27/12 0327 06/26/12 1800 06/26/12 0827 06/26/12 0805  NA 139 139 143 140  K 3.4* 2.7* -- --  CL 101 98 105 100  CO2 31 32 -- 27  BUN 8 7 9 9   CREATININE 0.43* 0.40* 0.70 0.50  CALCIUM 7.9*  7.7* -- 9.2  MG 2.5 1.2* -- --  PHOS 2.2* 2.3 -- --   Intake/Output      09/12 0701 - 09/13 0700 09/13 0701 - 09/14 0700   P.O. 90    I.V. (mL/kg) 320 (2.8)    Other 170    IV Piggyback 1100    Total Intake(mL/kg) 1680 (14.5)    Urine (mL/kg/hr) 4325 (1.6) 35   Total Output 4325 35   Net -2645 -35         Foley:  9/12  A:  No acute issues, did get furosemide in ER worried about K.  P:   Replace K and Phos. Lasix 40 mg PO x1. KVO IVF. Recheck lytes in AM.  GASTROINTESTINAL  Lab 06/27/12 0327 06/26/12 1800 06/26/12 0805  AST 23 29 56*  ALT 36* 41* 60*  ALKPHOS 106 101 133*  BILITOT 0.3 0.2* 0.2*  PROT 6.7 6.8 8.1  ALBUMIN 2.6* 2.5* 3.1*    A:  Mild elevated  LFTs. ? Due to hypotensions.  P:   Trend, f/u in am   HEMATOLOGIC  Lab 06/27/12 0327 06/26/12 1800 06/26/12 0827 06/26/12 0805  HGB 10.1* 10.4* 14.3 12.9  HCT 32.2* 32.2* 42.0 39.7  PLT 343 338 -- 453*  INR -- 1.02 -- --  APTT -- 26 -- --   A:  No acute issues P:  Trend cbc   INFECTIOUS  Lab 06/27/12 0327 06/26/12 1800 06/26/12 0805  WBC 10.3 12.0* 14.0*  PROCALCITON 0.48 0.65 --   Cultures: Sputum 9/12>>> BCx 2 9/12>>> UC 9/12>>> Influenza 9/12>>>negative Urine strep 9/12>>>pending Urine legionella 9/12>>>pending Antibiotics: levaquin 9/12>>> Rocephin 9/12>>> Vanc 9/12>>> tamiflu 9/12>>>9/13  A:   1) CAP r/o influenza 2) sepsis  P:   D/C tamiflu Once cultures are resulted will likely d/c vanc and rocephin and continue levaquin.  ENDOCRINE No results found for this basename: GLUCAP:5 in the last 168 hours A:  No acute issue  P:   Check chemistry  NEUROLOGIC  A:  No acute issue  P:   Supportive care   BEST PRACTICE / DISPOSITION Level of Care:  ICU  Primary Service:  PCCM  Consultants:  None  Code Status:  Full  Diet: NPO  DVT Px:  [place] heparin  GI Px: H2 blocker  Skin Integrity:  Intact  Social / Family:  Updated   CC time 35 min.  Alyson Reedy, M.D. [facility] Pulmonary/Critical Care Medicine. Pager: [phone]. After hours pager: [phone].

## 2012-06-27 NOTE — Progress Notes (Signed)
Pt taken off of bipap, placed on 40% venti mask.  Tolerating well at this time.

## 2012-06-27 NOTE — Progress Notes (Signed)
Sore throat; cough with minimal expiratory wheezing  Chloraseptic spray.   Albuterol for prn use.

## 2012-06-28 ENCOUNTER — Inpatient Hospital Stay (HOSPITAL_COMMUNITY): Payer: MEDICAID

## 2012-06-28 LAB — BASIC METABOLIC PANEL
CO2: 27 mEq/L (ref 19–32)
Chloride: 102 mEq/L (ref 96–112)
Potassium: 4.2 mEq/L (ref 3.5–5.1)
Sodium: 141 mEq/L (ref 135–145)

## 2012-06-28 LAB — CULTURE, RESPIRATORY W GRAM STAIN: Culture: NORMAL

## 2012-06-28 LAB — CBC
Platelets: 304 10*3/uL (ref 150–400)
RBC: 4.09 MIL/uL (ref 3.87–5.11)
WBC: 8 10*3/uL (ref 4.0–10.5)

## 2012-06-28 LAB — MAGNESIUM: Magnesium: 1.8 mg/dL (ref 1.5–2.5)

## 2012-06-28 LAB — PHOSPHORUS: Phosphorus: 3.8 mg/dL (ref 2.3–4.6)

## 2012-06-28 NOTE — Progress Notes (Signed)
Report received from Audria Nine, Charity fundraiser. No change from initial pm assessment. Will continue to follow the plan of care.

## 2012-06-28 NOTE — Progress Notes (Signed)
Name: Erin Kidd MRN: [id number] DOB: 1966-10-03    LOS: 2  Referring Provider: shelton Reason for Referral:  Acute resp failure   PULMONARY / CRITICAL CARE MEDICINE  HPI:   This is a 46 year old female who presented to the ED at [facility] on 9/12 after about 6 day h/o progressive productive cough, and 3 day h/o progressive dyspnea. States had been taking care of her grandchildren who had URI symptoms ~ 1 week prior. She reports soon after that she developed sore throat, runny nose, nasal congestion and cough. She reports that her sputum has become more green in color, and cough more frequent. She presented to the ER w/ 3 days of progressive dyspnea that got to the point she was SOB at rest. On arrival to the ER she was found to be hypoxic w/ sats in 70s, and in SVT. She was given adenosine and converted back to NSR. She was placed on BIPAP for hypoxia w/ decreased WOB. Initial CXR shows bilateral airspace disease. She is to be admitted to the ICU for acute resp failure.   Brief patient description:   46 year old female admitted on 9/12 w/ presumed CAP, resultant acute respiratory failure, SVT and sepsis.   Events Since Admission: More comfortable on bipap  Current Status: 9/14> Tx tp 1428 yest by DrYacoub CXR 9/14> IMPRESSION:  1. Right IJ central line removed.  2. Stable ventilation since 06/26/2012 with diffuse increased interstitial markings and basilar atelectasis.    Vital Signs: Temp:  [97.7 F (36.5 C)-98.1 F (36.7 C)] 98.1 F (36.7 C) (09/14 0527) Pulse Rate:  [80-86] 81  (09/14 0527) Resp:  [20-28] 20  (09/14 0527) BP: (103-145)/(31-75) 145/75 mmHg (09/14 0527) SpO2:  [91 %-97 %] 97 % (09/14 0527) Weight:  [116.529 kg (256 lb 14.4 oz)] 116.529 kg (256 lb 14.4 oz) (09/14 0527)  Physical Examination: General:  Acutely ill appearing white female, currently WOB improved.  Neuro:  Awake, oriented X 3 HEENT:  [place], no JVD Cardiovascular:  Tachy rrr Lungs:  Scattered rhonchi,  basilar rales  Abdomen:  Soft, non-tender  Musculoskeletal:  intact Skin:  Warm and flushed   Active Problems:  CAP (community acquired pneumonia)  Acute respiratory failure with hypoxia  SVT (supraventricular tachycardia)  Sepsis   ASSESSMENT AND PLAN  PULMONARY  Lab 06/27/12 0437  PHART 7.462*  PCO2ART 41.9  PO2ART 75.4*  HCO3 29.5*  O2SAT 94.8   Ventilator Settings:   CXR:  Bibasilar airspace disease.  ETT: NIV 9/12>>>  A:  1) acute respiratory failure in setting of bilateral pulmonary infiltrates. Diff dx: cap w/ evolving ALI, worried about influenza, less likely edema. Worried that this may get worse before it gets better.  2) CAP vs influenza  P:   > On O2 by [place] > on Rocephin, Levaquin, Vanco > on prn MDI, able to get a good deep breath & doesn't think she needs IS, Flutter.   CARDIOVASCULAR  Lab 06/27/12 0327 06/26/12 2136 06/26/12 1800  TROPONINI <0.30 <0.30 <0.30  LATICACIDVEN -- -- 1.5  PROBNP -- -- 783.2*   ECG:   Lines:   A: 1) sepsis 2) SVT converted after adenosine.  P:  KVO IVFs   RENAL  Lab 06/28/12 0540 06/27/12 0327 06/26/12 1800 06/26/12 0827 06/26/12 0805  NA 141 139 139 143 140  K 4.2 3.4* -- -- --  CL 102 101 98 105 100  CO2 27 31 32 -- 27  BUN 9 8 7 9  9  CREATININE 0.45* 0.43* 0.40* 0.70 0.50  CALCIUM 8.7 7.9* 7.7* -- 9.2  MG 1.8 2.5 1.2* -- --  PHOS 3.8 2.2* 2.3 -- --   Intake/Output      09/13 0701 - 09/14 0700 09/14 0701 - 09/15 0700   P.O.  240   I.V. (mL/kg)     Other 330    IV Piggyback 450    Total Intake(mL/kg) 780 (6.7) 240 (2.1)   Urine (mL/kg/hr) 530 (0.2)    Total Output 530    Net +250 +240        Urine Occurrence 1 x    Stool Occurrence 2 x     Foley:  9/12  A:  No acute issues, did get furosemide in ER worried about K.  P:   Replace K and Phos. Lasix 40 mg PO x1. KVO IVF. Recheck lytes in AM.  GASTROINTESTINAL  Lab 06/27/12 0327 06/26/12 1800 06/26/12 0805  AST 23 29 56*  ALT 36* 41* 60*   ALKPHOS 106 101 133*  BILITOT 0.3 0.2* 0.2*  PROT 6.7 6.8 8.1  ALBUMIN 2.6* 2.5* 3.1*    A:  Mild elevated LFTs. ? Due to hypotensions.  P:   Trend, f/u in am   HEMATOLOGIC  Lab 06/28/12 0540 06/27/12 0327 06/26/12 1800 06/26/12 0827 06/26/12 0805  HGB 11.3* 10.1* 10.4* 14.3 12.9  HCT 34.7* 32.2* 32.2* 42.0 39.7  PLT 304 343 338 -- 453*  INR -- -- 1.02 -- --  APTT -- -- 26 -- --   A:  No acute issues P:  Trend cbc   INFECTIOUS  Lab 06/28/12 0540 06/27/12 0327 06/26/12 1800 06/26/12 0805  WBC 8.0 10.3 12.0* 14.0*  PROCALCITON -- 0.48 0.65 --   Cultures: Sputum 9/12>>> BCx 2 9/12>>> UC 9/12>>> Influenza 9/12>>>negative Urine strep 9/12>>>pending Urine legionella 9/12>>>pending Antibiotics: levaquin 9/12>>> Rocephin 9/12>>> Vanc 9/12>>> tamiflu 9/12>>>9/13  A:   1) CAP r/o influenza 2) sepsis  P:   D/C tamiflu Once cultures are resulted will likely d/c vanc and rocephin and continue levaquin.  ENDOCRINE No results found for this basename: GLUCAP:5 in the last 168 hours A:  No acute issue  P:   Check chemistry  NEUROLOGIC  A:  No acute issue  P:   Supportive care   BEST PRACTICE / DISPOSITION Level of Care: Primary Service:  PCCM  Consultants:  None  Code Status:  Full  Diet: NPO  DVT Px:  [place] heparin  GI Px: H2 blocker  Skin Integrity:  Intact  Social / Family:  Updated    Jennalee Greaves M. Kriste Basque, MD  06/28/12 @ 1:49PM

## 2012-06-29 MED ORDER — VANCOMYCIN HCL 1000 MG IV SOLR
2000.0000 mg | Freq: Two times a day (BID) | INTRAVENOUS | Status: DC
  Administered 2012-06-30: 2000 mg via INTRAVENOUS
  Filled 2012-06-29: qty 2000

## 2012-06-29 MED ORDER — VANCOMYCIN HCL IN DEXTROSE 1-5 GM/200ML-% IV SOLN
1000.0000 mg | Freq: Once | INTRAVENOUS | Status: AC
  Administered 2012-06-29: 1000 mg via INTRAVENOUS
  Filled 2012-06-29: qty 200

## 2012-06-29 NOTE — Progress Notes (Signed)
ANTIBIOTIC CONSULT NOTE  Pharmacy Consult for Vancomycin/Ceftriaxone/Levaquin Indication: pneumonia  No Known Allergies  Patient Measurements: Height: 5\' 4"  (162.6 cm) Weight: 257 lb 1.6 oz (116.62 kg) IBW/kg (Calculated) : 54.7   Vital Signs: Temp: 97.2 F (36.2 C) (09/15 1400) Temp src: Oral (09/15 1400) BP: 141/81 mmHg (09/15 1400) Pulse Rate: 94  (09/15 1400) Intake/Output from previous day: 09/14 0701 - 09/15 0700 In: 2480 [P.O.:1400; IV Piggyback:1000] Out: -  Intake/Output from this shift:    Labs:  Basename 06/28/12 0540 06/27/12 0327 06/26/12 1800  WBC 8.0 10.3 12.0*  HGB 11.3* 10.1* 10.4*  PLT 304 343 338  LABCREA -- -- --  CREATININE 0.45* 0.43* 0.40*   Estimated Creatinine Clearance: 109.1 ml/min (by C-G formula based on Cr of 0.45).  Microbiology: Recent Results (from the past 720 hour(s))  URINE CULTURE     Status: Normal   Collection Time   06/26/12 10:39 AM      Component Value Range Status Comment   Specimen Description URINE, CATHETERIZED   Final    Special Requests NONE   Final    Culture  Setup Time 06/26/2012 13:29   Final    Colony Count NO GROWTH   Final    Culture NO GROWTH   Final    Report Status 06/27/2012 FINAL   Final   MRSA PCR SCREENING     Status: Normal   Collection Time   06/26/12  1:47 PM      Component Value Range Status Comment   MRSA by PCR NEGATIVE  NEGATIVE Final   CULTURE, EXPECTORATED SPUTUM-ASSESSMENT     Status: Normal   Collection Time   06/26/12  2:33 PM      Component Value Range Status Comment   Specimen Description SPUTUM   Final    Special Requests Immunocompromised   Final    Sputum evaluation     Final    Value: THIS SPECIMEN IS ACCEPTABLE. RESPIRATORY CULTURE REPORT TO FOLLOW.   Report Status 06/26/2012 FINAL   Final   CULTURE, RESPIRATORY     Status: Normal   Collection Time   06/26/12  2:33 PM      Component Value Range Status Comment   Specimen Description SPUTUM   Final    Special Requests NONE    Final    Gram Stain     Final    Value: ABUNDANT WBC PRESENT,BOTH PMN AND MONONUCLEAR     RARE SQUAMOUS EPITHELIAL CELLS PRESENT     FEW GRAM POSITIVE COCCI     IN PAIRS RARE GRAM POSITIVE RODS   Culture NORMAL OROPHARYNGEAL FLORA   Final    Report Status 06/28/2012 FINAL   Final   CULTURE, BLOOD (ROUTINE X 2)     Status: Normal (Preliminary result)   Collection Time   06/26/12  3:52 PM      Component Value Range Status Comment   Specimen Description BLOOD   Final    Special Requests Immunocompromised   Final    Culture  Setup Time 06/26/2012 20:21   Final    Culture     Final    Value:        BLOOD CULTURE RECEIVED NO GROWTH TO DATE CULTURE WILL BE HELD FOR 5 DAYS BEFORE ISSUING A FINAL NEGATIVE REPORT   Report Status PENDING   Incomplete   CULTURE, BLOOD (ROUTINE X 2)     Status: Normal (Preliminary result)   Collection Time   06/26/12  3:52 PM  Component Value Range Status Comment   Specimen Description BLOOD   Final    Special Requests Immunocompromised   Final    Culture  Setup Time 06/26/2012 20:21   Final    Culture     Final    Value:        BLOOD CULTURE RECEIVED NO GROWTH TO DATE CULTURE WILL BE HELD FOR 5 DAYS BEFORE ISSUING A FINAL NEGATIVE REPORT   Report Status PENDING   Incomplete     Assessment:  66 YOF presents with respiratory symptoms (CAP vs influenza) and sepsis.    Day #3/7 Vanc, Rocephin, Levaquin; 3 doses Tamiflu given 9/12-9/13)   Scr stable, WBC improved from admit, Afeb, no culture growth to date  Vanc trough subtherapeutic (5.7 mcg/ml) on 1g q12h   Goal of Therapy:  Vancomycin trough level 15-20 mcg/ml  Plan:   Increase vancomycin to 2gm IV q12h  Recheck trough at new steady state if continues  Ceftriaxone and Levaquin doses appropriate.  F/u SCr, culture results, de-escalation as appropriate  Loralee Pacas, PharmD, BCPS Pager: [phone] 06/29/2012,4:01 PM

## 2012-06-29 NOTE — Progress Notes (Signed)
Name: Liya Strollo MRN: [id number] DOB: 1966/02/20    LOS: 3  Referring Provider: shelton Reason for Referral:  Acute resp failure   PULMONARY / CRITICAL CARE MEDICINE  HPI:   This is a 46 year old female who presented to the ED at [facility] on 9/12 after about 6 day h/o progressive productive cough, and 3 day h/o progressive dyspnea. States had been taking care of her grandchildren who had URI symptoms ~ 1 week prior. She reports soon after that she developed sore throat, runny nose, nasal congestion and cough. She reports that her sputum has become more green in color, and cough more frequent. She presented to the ER w/ 3 days of progressive dyspnea that got to the point she was SOB at rest. On arrival to the ER she was found to be hypoxic w/ sats in 70s, and in SVT. She was given adenosine and converted back to NSR. She was placed on BIPAP for hypoxia w/ decreased WOB. Initial CXR shows bilateral airspace disease. She is to be admitted to the ICU for acute resp failure.   Brief patient description:   46 year old female admitted on 9/12 w/ presumed CAP, resultant acute respiratory failure, SVT and sepsis.   Events Since Admission: More comfortable on bipap  Current Status: 9/14> Tx tp 1428 yest by DrYacoub CXR 9/14> IMPRESSION:  1. Right IJ central line removed.  2. Stable ventilation since 06/26/2012 with diffuse increased interstitial markings and basilar atelectasis.    Vital Signs: Temp:  [98.1 F (36.7 C)-98.5 F (36.9 C)] 98.1 F (36.7 C) (09/15 0640) Pulse Rate:  [81-91] 81  (09/15 0640) Resp:  [18-19] 18  (09/15 0640) BP: (139-160)/(70-99) 160/99 mmHg (09/15 0640) SpO2:  [94 %-96 %] 94 % (09/15 0640) Weight:  [116.62 kg (257 lb 1.6 oz)] 116.62 kg (257 lb 1.6 oz) (09/15 0640)  Physical Examination: General:  Acutely ill appearing white female, currently WOB improved.  Neuro:  Awake, oriented X 3 HEENT:  [place], no JVD Cardiovascular:  Tachy rrr Lungs:  Scattered rhonchi,  basilar rales  Abdomen:  Soft, non-tender  Musculoskeletal:  intact Skin:  Warm and flushed   Active Problems:  CAP (community acquired pneumonia)  Acute respiratory failure with hypoxia  SVT (supraventricular tachycardia)  Sepsis   ASSESSMENT AND PLAN  PULMONARY  Lab 06/27/12 0437  PHART 7.462*  PCO2ART 41.9  PO2ART 75.4*  HCO3 29.5*  O2SAT 94.8   Ventilator Settings:   CXR:  Bibasilar airspace disease.  ETT: NIV 9/12>>>  A:  1) acute respiratory failure in setting of bilateral pulmonary infiltrates. Diff dx: cap w/ evolving ALI, worried about influenza, less likely edema. Worried that this may get worse before it gets better.  2) CAP vs influenza  P:   > On O2 by [place] > on Rocephin, Levaquin, Vanco > on prn MDI, able to get a good deep breath & doesn't think she needs IS, Flutter. 9/15> she agrees to try IS; plan f/u CXR in AM 9/16  CARDIOVASCULAR  Lab 06/27/12 0327 06/26/12 2136 06/26/12 1800  TROPONINI <0.30 <0.30 <0.30  LATICACIDVEN -- -- 1.5  PROBNP -- -- 783.2*   ECG:   Lines:   A: 1) sepsis 2) SVT converted after adenosine.  P:  KVO IVFs   RENAL  Lab 06/28/12 0540 06/27/12 0327 06/26/12 1800 06/26/12 0827 06/26/12 0805  NA 141 139 139 143 140  K 4.2 3.4* -- -- --  CL 102 101 98 105 100  CO2 27  31 32 -- 27  BUN 9 8 7 9 9   CREATININE 0.45* 0.43* 0.40* 0.70 0.50  CALCIUM 8.7 7.9* 7.7* -- 9.2  MG 1.8 2.5 1.2* -- --  PHOS 3.8 2.2* 2.3 -- --   Intake/Output      09/14 0701 - 09/15 0700 09/15 0701 - 09/16 0700   P.O. 1400    Other 80    IV Piggyback 1000    Total Intake(mL/kg) 2480 (21.3)    Urine (mL/kg/hr)     Total Output     Net +2480         Urine Occurrence 11 x    Stool Occurrence 3 x     Foley:  9/12  A:  No acute issues, did get furosemide in ER worried about K.  P:   Replace K and Phos. Lasix 40 mg PO x1. KVO IVF. Recheck lytes in AM.  GASTROINTESTINAL  Lab 06/27/12 0327 06/26/12 1800 06/26/12 0805  AST 23 29 56*    ALT 36* 41* 60*  ALKPHOS 106 101 133*  BILITOT 0.3 0.2* 0.2*  PROT 6.7 6.8 8.1  ALBUMIN 2.6* 2.5* 3.1*    A:  Mild elevated LFTs. ? Due to hypotensions.  P:   Trend, f/u in am   HEMATOLOGIC  Lab 06/28/12 0540 06/27/12 0327 06/26/12 1800 06/26/12 0827 06/26/12 0805  HGB 11.3* 10.1* 10.4* 14.3 12.9  HCT 34.7* 32.2* 32.2* 42.0 39.7  PLT 304 343 338 -- 453*  INR -- -- 1.02 -- --  APTT -- -- 26 -- --   A:  No acute issues P:  Trend cbc   INFECTIOUS  Lab 06/28/12 0540 06/27/12 0327 06/26/12 1800 06/26/12 0805  WBC 8.0 10.3 12.0* 14.0*  PROCALCITON -- 0.48 0.65 --   Cultures: Sputum 9/12>>> BCx 2 9/12>>> UC 9/12>>> Influenza 9/12>>>negative Urine strep 9/12>>>pending Urine legionella 9/12>>>pending Antibiotics: levaquin 9/12>>> Rocephin 9/12>>> Vanc 9/12>>> tamiflu 9/12>>>9/13  A:   1) CAP r/o influenza 2) sepsis  P:   D/C tamiflu Once cultures are resulted will likely d/c vanc and rocephin and continue levaquin.  ENDOCRINE No results found for this basename: GLUCAP:5 in the last 168 hours A:  No acute issue  P:   Check chemistry  NEUROLOGIC  A:  No acute issue  P:   Supportive care   BEST PRACTICE / DISPOSITION Level of Care: Primary Service:  PCCM  Consultants:  None  Code Status:  Full  Diet: NPO  DVT Px:  [place] heparin  GI Px: H2 blocker  Skin Integrity:  Intact  Social / Family:  Updated    Torsten Weniger M. Kriste Basque, MD  06/29/12 @ 1:41PM

## 2012-06-30 ENCOUNTER — Inpatient Hospital Stay (HOSPITAL_COMMUNITY): Payer: MEDICAID

## 2012-06-30 MED ORDER — FAMOTIDINE 20 MG PO TABS
20.0000 mg | ORAL_TABLET | Freq: Two times a day (BID) | ORAL | Status: DC
  Administered 2012-06-30 – 2012-07-01 (×2): 20 mg via ORAL
  Filled 2012-06-30 (×3): qty 1

## 2012-06-30 MED ORDER — OXYCODONE HCL 5 MG PO TABS
5.0000 mg | ORAL_TABLET | Freq: Once | ORAL | Status: AC
  Administered 2012-06-30: 5 mg via ORAL
  Filled 2012-06-30: qty 1

## 2012-06-30 MED ORDER — LEVOFLOXACIN 750 MG PO TABS
750.0000 mg | ORAL_TABLET | Freq: Every day | ORAL | Status: DC
  Administered 2012-06-30 – 2012-07-01 (×2): 750 mg via ORAL
  Filled 2012-06-30 (×2): qty 1

## 2012-06-30 NOTE — Progress Notes (Signed)
Name: Erin Kidd MRN: [id number] DOB: 19-Jan-1966    LOS: 4  Referring Provider: shelton Reason for Referral:  Acute resp failure   PULMONARY / CRITICAL CARE MEDICINE  HPI:   This is a 46 year old female who presented to the ED at [facility] on 9/12 after about 6 day h/o progressive productive cough, and 3 day h/o progressive dyspnea. States had been taking care of her grandchildren who had URI symptoms ~ 1 week prior. She reports soon after that she developed sore throat, runny nose, nasal congestion and cough. She reports that her sputum has become more green in color, and cough more frequent. She presented to the ER w/ 3 days of progressive dyspnea that got to the point she was SOB at rest. On arrival to the ER she was found to be hypoxic w/ sats in 70s, and in SVT. She was given adenosine and converted back to NSR. She was placed on BIPAP for hypoxia w/ decreased WOB. Initial CXR shows bilateral airspace disease. She is to be admitted to the ICU for acute resp failure.     Events Since Admission: More comfortable on bipap  Current Status: C/o dry cough afebrile  Vital Signs: Temp:  [97.2 F (36.2 C)-97.8 F (36.6 C)] 97.8 F (36.6 C) (09/16 0640) Pulse Rate:  [86-94] 89  (09/16 0640) Resp:  [18-20] 20  (09/16 0640) BP: (140-158)/(72-81) 140/72 mmHg (09/16 0640) SpO2:  [98 %-99 %] 98 % (09/16 0640) Weight:  [255 lb 4.7 oz (115.8 kg)] 255 lb 4.7 oz (115.8 kg) (09/16 0640)  Physical Examination: General:  NAD white female, No increase in wob on RA Neuro:  Awake, oriented X 3 HEENT:  [place], no JVD Cardiovascular:  Tachy rrr Lungs:  Scattered rhonchi,  Abdomen:  Soft, non-tender  Musculoskeletal:  intact Skin:  Warm and flushed   Active Problems:  CAP (community acquired pneumonia)  Acute respiratory failure with hypoxia  SVT (supraventricular tachycardia)  Sepsis   ASSESSMENT AND PLAN  PULMONARY  Lab 06/27/12 0437  PHART 7.462*  PCO2ART 41.9  PO2ART 75.4*  HCO3 29.5*    O2SAT 94.8   Ventilator Settings:   CXR:  Dg Chest 2 View  06/30/2012  *RADIOLOGY REPORT*  Clinical Data: Cough and shortness of breath.  Pneumonia.  CHEST - 2 VIEW  Comparison: One view chest 06/28/2012.  Findings: The heart is mildly enlarged.  Minimal left basilar airspace disease is improved.  The right lung is clear.  The upper lung fields are clear.  The visualized soft tissues and bony thorax are unremarkable.  IMPRESSION:  1.  Improving left basilar airspace disease.  This may represent some residual atelectasis or scarring. 2.  Borderline cardiomegaly without failure.   Original Report Authenticated By: Jamesetta Orleans. MATTERN, M.D.     ETT: NIV 9/12>>>off  A:  1) acute respiratory failure in setting of bilateral pulmonary infiltrates.  2) CAP vs influenza  P:   > On O2 by  > on Rocephin, Levaquin, Vanco. 9/16  vanc and roc dc'd > on prn MDI, able to get a good deep breath & doesn't think she needs IS, Flutter. 9/16 change to po abx for  Dc in am of 9/17  CARDIOVASCULAR  Lab 06/27/12 0327 06/26/12 2136 06/26/12 1800  TROPONINI <0.30 <0.30 <0.30  LATICACIDVEN -- -- 1.5  PROBNP -- -- 783.2*   ECG:   Lines:   A: 1) sepsis 2) SVT converted after adenosine.  P:  KVO IVFs   RENAL  Lab 06/28/12 0540 06/27/12 0327 06/26/12 1800 06/26/12 0827 06/26/12 0805  NA 141 139 139 143 140  K 4.2 3.4* -- -- --  CL 102 101 98 105 100  CO2 27 31 32 -- 27  BUN 9 8 7 9 9   CREATININE 0.45* 0.43* 0.40* 0.70 0.50  CALCIUM 8.7 7.9* 7.7* -- 9.2  MG 1.8 2.5 1.2* -- --  PHOS 3.8 2.2* 2.3 -- --   Intake/Output      09/15 0701 - 09/16 0700 09/16 0701 - 09/17 0700   P.O. 2400 240   Other     IV Piggyback 550    Total Intake(mL/kg) 2950 (25.5) 240 (2.1)   Net +2950 +240        Urine Occurrence 10 x     Foley:  9/12  A:  No acute issues, did get furosemide in ER worried about K.  P:   Replaced K and Phos. Lasix 40 mg PO x1. KVO IVF. Recheck lytes in  AM.  GASTROINTESTINAL  Lab 06/27/12 0327 06/26/12 1800 06/26/12 0805  AST 23 29 56*  ALT 36* 41* 60*  ALKPHOS 106 101 133*  BILITOT 0.3 0.2* 0.2*  PROT 6.7 6.8 8.1  ALBUMIN 2.6* 2.5* 3.1*    A:  Mild elevated LFTs. ? Due to hypotensions.  P:   Trend, f/u in am   HEMATOLOGIC  Lab 06/28/12 0540 06/27/12 0327 06/26/12 1800 06/26/12 0827 06/26/12 0805  HGB 11.3* 10.1* 10.4* 14.3 12.9  HCT 34.7* 32.2* 32.2* 42.0 39.7  PLT 304 343 338 -- 453*  INR -- -- 1.02 -- --  APTT -- -- 26 -- --   A:  No acute issues P:  Trend cbc   INFECTIOUS  Lab 06/28/12 0540 06/27/12 0327 06/26/12 1800 06/26/12 0805  WBC 8.0 10.3 12.0* 14.0*  PROCALCITON -- 0.48 0.65 --   Cultures: Sputum 9/12>>>nml flora BCx 2 9/12>>>ng UC 9/12>>>ng Influenza 9/12>>>negative Urine strep 9/12>>>pending Urine legionella 9/12>>>pending Antibiotics: levaquin 9/12>>> Rocephin 9/12>>>9/16 Vanc 9/12>>>9/16 tamiflu 9/12>>>9/13  A:   1) CAP r/o influenza 2) sepsis  P:   D/C tamiflu d/c vanc and rocephin and continue levaquin. CHk urine strep ag   She will FU with PCP in [place], [place] on dc   [place] Minor ACNP Adolph Pollack PCCM Pager [phone] till 3 pm If no answer page [phone] 06/30/2012, 11:04 AM   Erin Kidd V.  [phone]

## 2012-07-01 LAB — BASIC METABOLIC PANEL
Calcium: 9 mg/dL (ref 8.4–10.5)
GFR calc Af Amer: >90 mL/min (ref >90)
GFR calc non Af Amer: >90 mL/min (ref >90)
Potassium: 3.8 mEq/L (ref 3.5–5.1)
Sodium: 137 mEq/L (ref 135–145)

## 2012-07-01 MED ORDER — LEVOFLOXACIN 750 MG PO TABS: 750.0000 mg | ORAL_TABLET | Freq: Every day | ORAL | Status: DC

## 2012-07-01 NOTE — Discharge Summary (Signed)
Physician Discharge Summary  Patient ID: Erin Kidd MRN: [id number] DOB/AGE: 1966/09/02 46 y.o.  Admit date: 06/26/2012 Discharge date: 07/01/2012  Problem List Active Problems:  CAP (community acquired pneumonia)  Acute respiratory failure with hypoxia  SVT (supraventricular tachycardia)  Sepsis  HPI: This is a 46 year old female who presented to the ED at [facility] on 9/12 after about 6 day h/o progressive productive cough, and 3 day h/o progressive dyspnea. States had been taking care of her grandchildren who had URI symptoms ~ 1 week prior. She reports soon after that she developed sore throat, runny nose, nasal congestion and cough. She reports that her sputum has become more green in color, and cough more frequent. She presented to the ER w/ 3 days of progressive dyspnea that got to the point she was SOB at rest. On arrival to the ER she was found to be hypoxic w/ sats in 70s, and in SVT. She was given adenosine and converted back to NSR. She was placed on BIPAP for hypoxia w/ decreased WOB. Initial CXR shows bilateral airspace disease. She is to be admitted to the ICU for acute resp failure.   Hospital Course: Physical Examination:  General: NAD white female, No increase in wob on RA  Neuro: Awake, oriented X 3  HEENT: [place], no JVD  Cardiovascular: Tachy rrr  Lungs: Scattered rhonchi,  Abdomen: Soft, non-tender  Musculoskeletal: intact  Skin: Warm and flushed   ASSESSMENT AND PLAN  PULMONARY   Lab  06/27/12 0437   PHART  7.462*   PCO2ART  41.9   PO2ART  75.4*   HCO3  29.5*   O2SAT  94.8    Ventilator Settings:   CXR:  Dg Chest 2 View  06/30/2012 *RADIOLOGY REPORT* Clinical Data: Cough and shortness of breath. Pneumonia. CHEST - 2 VIEW Comparison: One view chest 06/28/2012. Findings: The heart is mildly enlarged. Minimal left basilar airspace disease is improved. The right lung is clear. The upper lung fields are clear. The visualized soft tissues and bony thorax are  unremarkable. IMPRESSION: 1. Improving left basilar airspace disease. This may represent some residual atelectasis or scarring. 2. Borderline cardiomegaly without failure. Original Report Authenticated By: Jamesetta Orleans. MATTERN, M.D.   ETT: NIV 9/12>>>off  A:  1) acute respiratory failure in setting of bilateral pulmonary infiltrates.  2) CAP vs influenza  P:  > On O2 by [place]  > on Rocephin, Levaquin, Vanco. 9/16 vanc and roc dc'd  > on prn MDI, able to get a good deep breath & doesn't think she needs IS, Flutter.  9/16 change to po abx for Dc in am of 9/17  CARDIOVASCULAR   Lab  06/27/12 0327  06/26/12 2136  06/26/12 1800   TROPONINI  <0.30  <0.30  <0.30   LATICACIDVEN  --  --  1.5   PROBNP  --  --  783.2*    ECG:  Lines:  A:  1) sepsis  2) SVT converted after adenosine.  P:  KVO IVFs  RENAL   Lab  06/28/12 0540  06/27/12 0327  06/26/12 1800  06/26/12 0827  06/26/12 0805   NA  141  139  139  143  140   K  4.2  3.4*  --  --  --   CL  102  101  98  105  100   CO2  27  31  32  --  27   BUN  9  8  7  9   9  CREATININE  0.45*  0.43*  0.40*  0.70  0.50   CALCIUM  8.7  7.9*  7.7*  --  9.2   MG  1.8  2.5  1.2*  --  --   PHOS  3.8  2.2*  2.3  --  --    Intake/Output  09/15 0701 - 09/16 0700 09/16 0701 - 09/17 0700  P.O. 2400 240  Other  IV Piggyback 550  Total Intake(mL/kg) 2950 (25.5) 240 (2.1)  Net +2950 +240  Urine Occurrence 10 x   Foley: 9/12  A: No acute issues, did get furosemide in ER worried about K.  P:  Replaced K and Phos.  Lasix 40 mg PO x1.  KVO IVF.  Recheck lytes in AM.  GASTROINTESTINAL   Lab  06/27/12 0327  06/26/12 1800  06/26/12 0805   AST  23  29  56*   ALT  36*  41*  60*   ALKPHOS  106  101  133*   BILITOT  0.3  0.2*  0.2*   PROT  6.7  6.8  8.1   ALBUMIN  2.6*  2.5*  3.1*    A: Mild elevated LFTs. ? Due to hypotensions.  P:  Trend, f/u in am  HEMATOLOGIC   Lab  06/28/12 0540  06/27/12 0327  06/26/12 1800  06/26/12 0827  06/26/12 0805     HGB  11.3*  10.1*  10.4*  14.3  12.9   HCT  34.7*  32.2*  32.2*  42.0  39.7   PLT  304  343  338  --  453*   INR  --  --  1.02  --  --   APTT  --  --  26  --  --    A: No acute issues  P:  Trend cbc  INFECTIOUS   Lab  06/28/12 0540  06/27/12 0327  06/26/12 1800  06/26/12 0805   WBC  8.0  10.3  12.0*  14.0*   PROCALCITON  --  0.48  0.65  --    Cultures:  Sputum 9/12>>>nml flora  BCx 2 9/12>>>ng  UC 9/12>>>ng  Influenza 9/12>>>negative  Urine strep 9/12>>>pending  Urine legionella 9/12>>>pending  Antibiotics:  levaquin 9/12>>>  Rocephin 9/12>>>9/16  Vanc 9/12>>>9/16  tamiflu 9/12>>>9/13  A:  1) CAP r/o influenza  2) sepsis  P:  D/C tamiflu  d/c vanc and rocephin and continue levaquin.  CHk urine strep ag  She will FU with PCP in [place], [place] on dc       Labs at discharge Lab Results  Component Value Date   CREATININE 0.52 07/01/2012   BUN 9 07/01/2012   NA 137 07/01/2012   K 3.8 07/01/2012   CL 100 07/01/2012   CO2 26 07/01/2012   Lab Results  Component Value Date   WBC 8.0 06/28/2012   HGB 11.3* 06/28/2012   HCT 34.7* 06/28/2012   MCV 84.8 06/28/2012   PLT 304 06/28/2012   Lab Results  Component Value Date   ALT 36* 06/27/2012   AST 23 06/27/2012   ALKPHOS 106 06/27/2012   BILITOT 0.3 06/27/2012   Lab Results  Component Value Date   INR 1.02 06/26/2012    Current radiology studies Dg Chest 2 View  06/30/2012  *RADIOLOGY REPORT*  Clinical Data: Cough and shortness of breath.  Pneumonia.  CHEST - 2 VIEW  Comparison: One view chest 06/28/2012.  Findings: The heart is mildly enlarged.  Minimal left basilar airspace disease  is improved.  The right lung is clear.  The upper lung fields are clear.  The visualized soft tissues and bony thorax are unremarkable.  IMPRESSION:  1.  Improving left basilar airspace disease.  This may represent some residual atelectasis or scarring. 2.  Borderline cardiomegaly without failure.   Original Report Authenticated By:  Jamesetta Orleans. MATTERN, M.D.     Disposition:  01-Home or Self Care  Discharge Orders    Future Orders Please Complete By Expires   Discharge patient          Medication List     As of 07/01/2012  9:38 AM    STOP taking these medications         FIBER (GUAR GUM) PO      TAKE these medications         cetirizine 10 MG tablet   Commonly known as: ZYRTEC   Take 10 mg by mouth daily.      Glucosa-Chondr-Na Chondr-MSM [phone] MG Tabs   Take 1 tablet by mouth daily.      ibuprofen 200 MG tablet   Commonly known as: ADVIL,MOTRIN   Take 600 mg by mouth every 6 (six) hours as needed. pain      levofloxacin 750 MG tablet   Commonly known as: LEVAQUIN   Take 1 tablet (750 mg total) by mouth daily.      multivitamin with minerals Tabs   Take 1 tablet by mouth daily.      ROBITUSSIN ALLERGY/COUGH PO   Take 5 mLs by mouth at bedtime as needed. Cough           Follow-up Information    On 07/03/2012 to follow up. (10:00am)    Contact information:   Trixie Rude MD [address]. [id number]         Discharged Condition: Good Vital signs at Discharge. Temp:  [98 F (36.7 C)-98.6 F (37 C)] 98.6 F (37 C) (09/17 2956) Pulse Rate:  [83-92] 92  (09/17 0611) Resp:  [17-18] 18  (09/17 0611) BP: (135-159)/(74-76) 159/76 mmHg (09/17 0611) SpO2:  [94 %-99 %] 94 % (09/17 0611) Weight:  [254 lb 3.2 oz (115.304 kg)] 254 lb 3.2 oz (115.304 kg) (09/17 2130) Office follow up Special Information or instructions. Signed: Brett Canales Minor ACNP [facility] PCCM Pager [phone] till 3 pm If no answer page [phone] 07/01/2012, 9:38 AM   [address] [place] [phone]

## 2012-07-02 LAB — CULTURE, BLOOD (ROUTINE X 2): Culture: NO GROWTH

## 2014-08-20 ENCOUNTER — Emergency Department (HOSPITAL_COMMUNITY)
Admission: EM | Admit: 2014-08-20 | Discharge: 2014-08-20 | Disposition: A | Discharge status: Home / Self Care | Payer: Self-pay | Attending: Emergency Medicine | Admitting: Emergency Medicine

## 2014-08-20 ENCOUNTER — Encounter (HOSPITAL_COMMUNITY): Payer: Self-pay | Admitting: *Deleted

## 2014-08-20 ENCOUNTER — Emergency Department (HOSPITAL_COMMUNITY): Payer: Self-pay

## 2014-08-20 DIAGNOSIS — Z792 Long term (current) use of antibiotics: Secondary | ICD-10-CM | POA: Insufficient documentation

## 2014-08-20 DIAGNOSIS — M545 Low back pain: Secondary | ICD-10-CM | POA: Insufficient documentation

## 2014-08-20 DIAGNOSIS — M546 Pain in thoracic spine: Secondary | ICD-10-CM | POA: Insufficient documentation

## 2014-08-20 DIAGNOSIS — Z8659 Personal history of other mental and behavioral disorders: Secondary | ICD-10-CM | POA: Insufficient documentation

## 2014-08-20 DIAGNOSIS — Z79899 Other long term (current) drug therapy: Secondary | ICD-10-CM | POA: Insufficient documentation

## 2014-08-20 DIAGNOSIS — M797 Fibromyalgia: Secondary | ICD-10-CM | POA: Insufficient documentation

## 2014-08-20 DIAGNOSIS — M1990 Unspecified osteoarthritis, unspecified site: Secondary | ICD-10-CM | POA: Insufficient documentation

## 2014-08-20 DIAGNOSIS — E669 Obesity, unspecified: Secondary | ICD-10-CM | POA: Insufficient documentation

## 2014-08-20 DIAGNOSIS — G8911 Acute pain due to trauma: Secondary | ICD-10-CM | POA: Insufficient documentation

## 2014-08-20 DIAGNOSIS — M549 Dorsalgia, unspecified: Secondary | ICD-10-CM

## 2014-08-20 MED ORDER — ETODOLAC 500 MG PO TABS: 500.0000 mg | ORAL_TABLET | Freq: Two times a day (BID) | ORAL | Status: DC

## 2014-08-20 MED ORDER — HYDROMORPHONE HCL 2 MG/ML IJ SOLN
2.0000 mg | Freq: Once | INTRAMUSCULAR | Status: AC
  Administered 2014-08-20: 2 mg via INTRAMUSCULAR
  Filled 2014-08-20: qty 1

## 2014-08-20 MED ORDER — CYCLOBENZAPRINE HCL 10 MG PO TABS: 10.0000 mg | ORAL_TABLET | Freq: Two times a day (BID) | ORAL | Status: DC | PRN

## 2014-08-20 NOTE — Discharge Instructions (Signed)
Back Pain, Adult °Back pain is very common. The pain often gets better over time. The cause of back pain is usually not dangerous. Most people can learn to manage their back pain on their own.  °HOME CARE  °· Stay active. Start with short walks on flat ground if you can. Try to walk farther each day. °· Do not sit, drive, or stand in one place for more than 30 minutes. Do not stay in bed. °· Do not avoid exercise or work. Activity can help your back heal faster. °· Be careful when you bend or lift an object. Bend at your knees, keep the object close to you, and do not twist. °· Sleep on a firm mattress. Lie on your side, and bend your knees. If you lie on your back, put a pillow under your knees. °· Only take medicines as told by your doctor. °· Put ice on the injured area. °¨ Put ice in a plastic bag. °¨ Place a towel between your skin and the bag. °¨ Leave the ice on for 15-20 minutes, 03-04 times a day for the first 2 to 3 days. After that, you can switch between ice and heat packs. °· Ask your doctor about back exercises or massage. °· Avoid feeling anxious or stressed. Find good ways to deal with stress, such as exercise. °GET HELP RIGHT AWAY IF:  °· Your pain does not go away with rest or medicine. °· Your pain does not go away in 1 week. °· You have new problems. °· You do not feel well. °· The pain spreads into your legs. °· You cannot control when you poop (bowel movement) or pee (urinate). °· Your arms or legs feel weak or lose feeling (numbness). °· You feel sick to your stomach (nauseous) or throw up (vomit). °· You have belly (abdominal) pain. °· You feel like you may pass out (faint). °MAKE SURE YOU:  °· Understand these instructions. °· Will watch your condition. °· Will get help right away if you are not doing well or get worse. °Document Released: 03/19/2008 Document Revised: 12/24/2011 Document Reviewed: 02/02/2014 °ExitCare® Patient Information ©2015 [employer]. This information is not intended  to replace advice given to you by your health care provider. Make sure you discuss any questions you have with your health care provider. ° °
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
## 2014-09-21 ENCOUNTER — Encounter (HOSPITAL_COMMUNITY): Payer: Self-pay

## 2014-09-21 ENCOUNTER — Emergency Department (HOSPITAL_COMMUNITY)
Admission: EM | Admit: 2014-09-21 | Discharge: 2014-09-21 | Disposition: A | Discharge status: Home / Self Care | Payer: Self-pay | Attending: Emergency Medicine | Admitting: Emergency Medicine

## 2014-09-21 DIAGNOSIS — S24109A Unspecified injury at unspecified level of thoracic spinal cord, initial encounter: Secondary | ICD-10-CM | POA: Insufficient documentation

## 2014-09-21 DIAGNOSIS — S3992XA Unspecified injury of lower back, initial encounter: Secondary | ICD-10-CM | POA: Insufficient documentation

## 2014-09-21 DIAGNOSIS — R000 Tachycardia, unspecified: Secondary | ICD-10-CM | POA: Insufficient documentation

## 2014-09-21 DIAGNOSIS — R200 Anesthesia of skin: Secondary | ICD-10-CM | POA: Insufficient documentation

## 2014-09-21 DIAGNOSIS — Y998 Other external cause status: Secondary | ICD-10-CM | POA: Insufficient documentation

## 2014-09-21 DIAGNOSIS — Z8659 Personal history of other mental and behavioral disorders: Secondary | ICD-10-CM | POA: Insufficient documentation

## 2014-09-21 DIAGNOSIS — Y9289 Other specified places as the place of occurrence of the external cause: Secondary | ICD-10-CM | POA: Insufficient documentation

## 2014-09-21 DIAGNOSIS — Z9889 Other specified postprocedural states: Secondary | ICD-10-CM | POA: Insufficient documentation

## 2014-09-21 DIAGNOSIS — M5441 Lumbago with sciatica, right side: Secondary | ICD-10-CM

## 2014-09-21 DIAGNOSIS — Z79899 Other long term (current) drug therapy: Secondary | ICD-10-CM | POA: Insufficient documentation

## 2014-09-21 DIAGNOSIS — S199XXA Unspecified injury of neck, initial encounter: Secondary | ICD-10-CM | POA: Insufficient documentation

## 2014-09-21 DIAGNOSIS — Y9389 Activity, other specified: Secondary | ICD-10-CM | POA: Insufficient documentation

## 2014-09-21 DIAGNOSIS — X58XXXA Exposure to other specified factors, initial encounter: Secondary | ICD-10-CM | POA: Insufficient documentation

## 2014-09-21 DIAGNOSIS — M1990 Unspecified osteoarthritis, unspecified site: Secondary | ICD-10-CM | POA: Insufficient documentation

## 2014-09-21 MED ORDER — OXYCODONE-ACETAMINOPHEN 5-325 MG PO TABS
2.0000 | ORAL_TABLET | Freq: Once | ORAL | Status: AC
  Administered 2014-09-21: 2 via ORAL
  Filled 2014-09-21: qty 2

## 2014-09-21 MED ORDER — DIAZEPAM 5 MG PO TABS
10.0000 mg | ORAL_TABLET | Freq: Once | ORAL | Status: AC
  Administered 2014-09-21: 10 mg via ORAL
  Filled 2014-09-21: qty 2

## 2014-09-21 MED ORDER — OXYCODONE-ACETAMINOPHEN 5-325 MG PO TABS: 1.0000 | ORAL_TABLET | ORAL | Status: DC | PRN

## 2014-09-21 MED ORDER — DIAZEPAM 5 MG PO TABS: 5.0000 mg | ORAL_TABLET | Freq: Four times a day (QID) | ORAL | Status: DC | PRN

## 2014-09-21 MED ORDER — HYDROMORPHONE HCL 1 MG/ML IJ SOLN
1.0000 mg | Freq: Once | INTRAMUSCULAR | Status: AC
  Administered 2014-09-21: 1 mg via INTRAMUSCULAR
  Filled 2014-09-21: qty 1

## 2014-09-21 MED ORDER — IBUPROFEN 800 MG PO TABS
800.0000 mg | ORAL_TABLET | Freq: Once | ORAL | Status: AC
  Administered 2014-09-21: 800 mg via ORAL
  Filled 2014-09-21: qty 1
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
## 2014-09-21 NOTE — Discharge Instructions (Signed)
1. Medications: valium, percocet, usual home medications 2. Treatment: rest, drink plenty of fluids, gentle stretching as discussed, alternate ice and heat 3. Follow Up: Please followup with your primary doctor in 3 days for discussion of your diagnoses and further evaluation after today's visit; if you do not have a primary care doctor use the resource guide provided to find one;  Return to the ER for worsening back pain, difficulty walking, loss of bowel or bladder control or other concerning symptoms    Back Exercises Back exercises help treat and prevent back injuries. The goal of back exercises is to increase the strength of your abdominal and back muscles and the flexibility of your back. These exercises should be started when you no longer have back pain. Back exercises include:  Pelvic Tilt. Lie on your back with your knees bent. Tilt your pelvis until the lower part of your back is against the floor. Hold this position 5 to 10 sec and repeat 5 to 10 times.  Knee to Chest. Pull first 1 knee up against your chest and hold for 20 to 30 seconds, repeat this with the other knee, and then both knees. This may be done with the other leg straight or bent, whichever feels better.  Sit-Ups or Curl-Ups. Bend your knees 90 degrees. Start with tilting your pelvis, and do a partial, slow sit-up, lifting your trunk only 30 to 45 degrees off the floor. Take at least 2 to 3 seconds for each sit-up. Do not do sit-ups with your knees out straight. If partial sit-ups are difficult, simply do the above but with only tightening your abdominal muscles and holding it as directed.  Hip-Lift. Lie on your back with your knees flexed 90 degrees. Push down with your feet and shoulders as you raise your hips a couple inches off the floor; hold for 10 seconds, repeat 5 to 10 times.  Back arches. Lie on your stomach, propping yourself up on bent elbows. Slowly press on your hands, causing an arch in your low back. Repeat  3 to 5 times. Any initial stiffness and discomfort should lessen with repetition over time.  Shoulder-Lifts. Lie face down with arms beside your body. Keep hips and torso pressed to floor as you slowly lift your head and shoulders off the floor. Do not overdo your exercises, especially in the beginning. Exercises may cause you some mild back discomfort which lasts for a few minutes; however, if the pain is more severe, or lasts for more than 15 minutes, do not continue exercises until you see your caregiver. Improvement with exercise therapy for back problems is slow.  See your caregivers for assistance with developing a proper back exercise program. Document Released: 11/08/2004 Document Revised: 12/24/2011 Document Reviewed: 08/02/2011 [facility] Patient Information 2015 [employer] [place]. This information is not intended to replace advice given to you by your health care provider. Make sure you discuss any questions you have with your health care provider.   Sciatica Sciatica is pain, weakness, numbness, or tingling along the path of the sciatic nerve. The nerve starts in the lower back and runs down the back of each leg. The nerve controls the muscles in the lower leg and in the back of the knee, while also providing sensation to the back of the thigh, lower leg, and the sole of your foot. Sciatica is a symptom of another medical condition. For instance, nerve damage or certain conditions, such as a herniated disk or bone spur on the spine, pinch or  put pressure on the sciatic nerve. This causes the pain, weakness, or other sensations normally associated with sciatica. Generally, sciatica only affects one side of the body. CAUSES   Herniated or slipped disc.  Degenerative disk disease.  A pain disorder involving the narrow muscle in the buttocks (piriformis syndrome).  Pelvic injury or fracture.  Pregnancy.  Tumor (rare). SYMPTOMS  Symptoms can vary from mild to very severe. The symptoms  usually travel from the low back to the buttocks and down the back of the leg. Symptoms can include:  Mild tingling or dull aches in the lower back, leg, or hip.  Numbness in the back of the calf or sole of the foot.  Burning sensations in the lower back, leg, or hip.  Sharp pains in the lower back, leg, or hip.  Leg weakness.  Severe back pain inhibiting movement. These symptoms may get worse with coughing, sneezing, laughing, or prolonged sitting or standing. Also, being overweight may worsen symptoms. DIAGNOSIS  Your caregiver will perform a physical exam to look for common symptoms of sciatica. He or she may ask you to do certain movements or activities that would trigger sciatic nerve pain. Other tests may be performed to find the cause of the sciatica. These may include:  Blood tests.  X-rays.  Imaging tests, such as an MRI or CT scan. TREATMENT  Treatment is directed at the cause of the sciatic pain. Sometimes, treatment is not necessary and the pain and discomfort goes away on its own. If treatment is needed, your caregiver may suggest:  Over-the-counter medicines to relieve pain.  Prescription medicines, such as anti-inflammatory medicine, muscle relaxants, or narcotics.  Applying heat or ice to the painful area.  Steroid injections to lessen pain, irritation, and inflammation around the nerve.  Reducing activity during periods of pain.  Exercising and stretching to strengthen your abdomen and improve flexibility of your spine. Your caregiver may suggest losing weight if the extra weight makes the back pain worse.  Physical therapy.  Surgery to eliminate what is pressing or pinching the nerve, such as a bone spur or part of a herniated disk. HOME CARE INSTRUCTIONS   Only take over-the-counter or prescription medicines for pain or discomfort as directed by your caregiver.  Apply ice to the affected area for 20 minutes, 3-4 times a day for the first 48-72 hours.  Then try heat in the same way.  Exercise, stretch, or perform your usual activities if these do not aggravate your pain.  Attend physical therapy sessions as directed by your caregiver.  Keep all follow-up appointments as directed by your caregiver.  Do not wear high heels or shoes that do not provide proper support.  Check your mattress to see if it is too soft. A firm mattress may lessen your pain and discomfort. SEEK IMMEDIATE MEDICAL CARE IF:   You lose control of your bowel or bladder (incontinence).  You have increasing weakness in the lower back, pelvis, buttocks, or legs.  You have redness or swelling of your back.  You have a burning sensation when you urinate.  You have pain that gets worse when you lie down or awakens you at night.  Your pain is worse than you have experienced in the past.  Your pain is lasting longer than 4 weeks.  You are suddenly losing weight without reason. MAKE SURE YOU:  Understand these instructions.  Will watch your condition.  Will get help right away if you are not doing well or get  worse. Document Released: 09/25/2001 Document Revised: 04/01/2012 Document Reviewed: 02/10/2012 [facility] Patient Information 2015 [place], [place]. This information is not intended to replace advice given to you by your health care provider. Make sure you discuss any questions you have with your health care provider.
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
## 2014-10-01 ENCOUNTER — Encounter: Payer: Self-pay | Admitting: Internal Medicine

## 2014-10-01 ENCOUNTER — Ambulatory Visit: Payer: Self-pay | Attending: Internal Medicine | Admitting: Internal Medicine

## 2014-10-01 DIAGNOSIS — I10 Essential (primary) hypertension: Secondary | ICD-10-CM

## 2014-10-01 DIAGNOSIS — M1990 Unspecified osteoarthritis, unspecified site: Secondary | ICD-10-CM | POA: Insufficient documentation

## 2014-10-01 DIAGNOSIS — F4310 Post-traumatic stress disorder, unspecified: Secondary | ICD-10-CM | POA: Insufficient documentation

## 2014-10-01 DIAGNOSIS — M797 Fibromyalgia: Secondary | ICD-10-CM | POA: Insufficient documentation

## 2014-10-01 DIAGNOSIS — Z79899 Other long term (current) drug therapy: Secondary | ICD-10-CM | POA: Insufficient documentation

## 2014-10-01 DIAGNOSIS — E119 Type 2 diabetes mellitus without complications: Secondary | ICD-10-CM

## 2014-10-01 DIAGNOSIS — F319 Bipolar disorder, unspecified: Secondary | ICD-10-CM | POA: Insufficient documentation

## 2014-10-01 DIAGNOSIS — M5441 Lumbago with sciatica, right side: Secondary | ICD-10-CM

## 2014-10-01 LAB — GLUCOSE, POCT (MANUAL RESULT ENTRY): POC GLUCOSE: 174 mg/dL — AB (ref 70–99)

## 2014-10-01 LAB — POCT GLYCOSYLATED HEMOGLOBIN (HGB A1C): HEMOGLOBIN A1C: 7.9

## 2014-10-01 MED ORDER — HYDROCHLOROTHIAZIDE 12.5 MG PO CAPS: 12.5000 mg | ORAL_CAPSULE | Freq: Every day | ORAL | Status: AC

## 2014-10-01 MED ORDER — METFORMIN HCL 1000 MG PO TABS: 500.0000 mg | ORAL_TABLET | Freq: Two times a day (BID) | ORAL | Status: DC

## 2014-10-01 NOTE — Progress Notes (Signed)
HFU Back pain
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
## 2014-10-01 NOTE — Patient Instructions (Signed)
Diabetes and Foot Care Diabetes may cause you to have problems because of poor blood supply (circulation) to your feet and legs. This may cause the skin on your feet to become thinner, break easier, and heal more slowly. Your skin may become dry, and the skin may peel and crack. You may also have nerve damage in your legs and feet causing decreased feeling in them. You may not notice minor injuries to your feet that could lead to infections or more serious problems. Taking care of your feet is one of the most important things you can do for yourself.  HOME CARE INSTRUCTIONS  Wear shoes at all times, even in the house. Do not go barefoot. Bare feet are easily injured.  Check your feet daily for blisters, cuts, and redness. If you cannot see the bottom of your feet, use a mirror or ask someone for help.  Wash your feet with warm water (do not use hot water) and mild soap. Then pat your feet and the areas between your toes until they are completely dry. Do not soak your feet as this can dry your skin.  Apply a moisturizing lotion or petroleum jelly (that does not contain alcohol and is unscented) to the skin on your feet and to dry, brittle toenails. Do not apply lotion between your toes.  Trim your toenails straight across. Do not dig under them or around the cuticle. File the edges of your nails with an emery board or nail file.  Do not cut corns or calluses or try to remove them with medicine.  Wear clean socks or stockings every day. Make sure they are not too tight. Do not wear knee-high stockings since they may decrease blood flow to your legs.  Wear shoes that fit properly and have enough cushioning. To break in new shoes, wear them for just a few hours a day. This prevents you from injuring your feet. Always look in your shoes before you put them on to be sure there are no objects inside.  Do not cross your legs. This may decrease the blood flow to your feet.  If you find a minor scrape,  cut, or break in the skin on your feet, keep it and the skin around it clean and dry. These areas may be cleansed with mild soap and water. Do not cleanse the area with peroxide, alcohol, or iodine.  When you remove an adhesive bandage, be sure not to damage the skin around it.  If you have a wound, look at it several times a day to make sure it is healing.  Do not use heating pads or hot water bottles. They may burn your skin. If you have lost feeling in your feet or legs, you may not know it is happening until it is too late.  Make sure your health care provider performs a complete foot exam at least annually or more often if you have foot problems. Report any cuts, sores, or bruises to your health care provider immediately. SEEK MEDICAL CARE IF:   You have an injury that is not healing.  You have cuts or breaks in the skin.  You have an ingrown nail.  You notice redness on your legs or feet.  You feel burning or tingling in your legs or feet.  You have pain or cramps in your legs and feet.  Your legs or feet are numb.  Your feet always feel cold. SEEK IMMEDIATE MEDICAL CARE IF:   There is increasing redness,   swelling, or pain in or around a wound.  There is a red line that goes up your leg.  Pus is coming from a wound.  You develop a fever or as directed by your health care provider.  You notice a bad smell coming from an ulcer or wound. Document Released: 09/28/2000 Document Revised: 06/03/2013 Document Reviewed: 03/10/2013 Exit[employer] Patient Information 2015 [employer]. This information is not intended to replace advice given to you by your health care provider. Make sure you discuss any questions you have with your health care provider.  

## 2014-10-02 LAB — MICROALBUMIN, URINE: MICROALB UR: 2.7 mg/dL — AB (ref <2.0)

## 2017-09-15 ENCOUNTER — Emergency Department (HOSPITAL_COMMUNITY): Payer: Medicaid - Out of State

## 2017-09-15 ENCOUNTER — Encounter (HOSPITAL_COMMUNITY): Payer: Self-pay | Admitting: Emergency Medicine

## 2017-09-15 ENCOUNTER — Emergency Department (HOSPITAL_COMMUNITY)
Admission: EM | Admit: 2017-09-15 | Discharge: 2017-09-15 | Disposition: A | Discharge status: Home / Self Care | Payer: Medicaid - Out of State | Attending: Emergency Medicine | Admitting: Emergency Medicine

## 2017-09-15 DIAGNOSIS — W19XXXA Unspecified fall, initial encounter: Secondary | ICD-10-CM

## 2017-09-15 DIAGNOSIS — M25512 Pain in left shoulder: Secondary | ICD-10-CM | POA: Diagnosis not present

## 2017-09-15 DIAGNOSIS — W07XXXA Fall from chair, initial encounter: Secondary | ICD-10-CM | POA: Insufficient documentation

## 2017-09-15 DIAGNOSIS — Z79899 Other long term (current) drug therapy: Secondary | ICD-10-CM | POA: Insufficient documentation

## 2017-09-15 DIAGNOSIS — E119 Type 2 diabetes mellitus without complications: Secondary | ICD-10-CM | POA: Diagnosis not present

## 2017-09-15 DIAGNOSIS — Z7984 Long term (current) use of oral hypoglycemic drugs: Secondary | ICD-10-CM | POA: Diagnosis not present

## 2017-09-15 DIAGNOSIS — M545 Low back pain: Secondary | ICD-10-CM | POA: Insufficient documentation

## 2017-09-15 HISTORY — DX: Other chronic pain: G89.29

## 2017-09-15 HISTORY — DX: Bursopathy, unspecified: M71.9

## 2017-09-15 HISTORY — DX: Post-traumatic stress disorder, unspecified: F43.10

## 2017-09-15 HISTORY — DX: Anxiety disorder, unspecified: F41.9

## 2017-09-15 NOTE — ED Triage Notes (Signed)
Pt states she fell yesterday out of a chair onto carpet onto L side. Now c/o lower back pain, has hx of chronic lower back pain. Followed by pain clinic. Taken 2  10mg  oxycodone today without relief.

## 2017-09-15 NOTE — Discharge Instructions (Signed)
Continue your current medications,  monitor for fever, worsening symptoms

## 2017-09-15 NOTE — ED Notes (Addendum)
Pt fell out of chair yesterday early morning.  Bruising noted to left shoulder, pt states she landed on left side.  Also c/o knot to lower back on right side of spine.  C/o lower back pain

## 2017-09-15 NOTE — ED Provider Notes (Signed)
[facility] EMERGENCY DEPARTMENT Provider Note   CSN: [id number] Arrival date & time: 09/15/17  1328     History   Chief Complaint Chief Complaint  Patient presents with  . Fall    HPI Erin Kidd is a 51 y.o. female.  HPI Patient presents to the emergency room for evaluation of left shoulder and lower back pain.  Patient was sitting in a chair yesterday when she fell out of the chair onto the carpet onto her left side.  Patient's not exactly sure what happened that made her fall.  She denies any loss of consciousness however.  She does have history of chronic back pain trouble.  Her pain is increasing.  Pain is sharp and shoots down her leg.  She tried taking 210 mg oxycodone today without relief.  She denies any focal weakness.  No difficulty urinating.  No incontinence.  No fevers or chills. Past Medical History:  Diagnosis Date  . Anxiety   . Arthritis   . Bipolar 1 disorder (HCC)   . Bursitis   . Chronic pain   . Diabetes mellitus without complication (HCC)   . Fibromyalgia   . Herniated disc   . PTSD (post-traumatic stress disorder)     Patient Active Problem List   Diagnosis Date Noted  . CAP (community acquired pneumonia) 06/26/2012  . Acute respiratory failure with hypoxia (HCC) 06/26/2012  . SVT (supraventricular tachycardia) (HCC) 06/26/2012  . Sepsis(995.91) 06/26/2012    Past Surgical History:  Procedure Laterality Date  . BACK SURGERY    . CERVICAL SPINE SURGERY    . CESAREAN SECTION    . HERNIA REPAIR    . TONSILLECTOMY      OB History    No data available       Home Medications    Prior to Admission medications   Medication Sig Start Date End Date Taking? Authorizing Provider  acetaminophen (TYLENOL) 500 MG tablet Take 1,000 mg by mouth every 6 (six) hours as needed for moderate pain.   Yes [provider]  dicyclomine (BENTYL) 10 MG capsule Take 10 mg by mouth 4 (four) times daily -  before meals and at bedtime.   Yes [provider]  diphenhydrAMINE (BENADRYL) 25 mg capsule Take 25 mg by mouth every 6 (six) hours as needed for allergies (allergies).   Yes [provider]  fluticasone (FLONASE) 50 MCG/ACT nasal spray Place 2 sprays into both nostrils daily.   Yes [provider]  hydrochlorothiazide (MICROZIDE) 12.5 MG capsule Take 1 capsule (12.5 mg total) by mouth daily. 10/01/14  Yes Ambrose Finland, NP  lamoTRIgine (LAMICTAL) 25 MG tablet Take 25 mg by mouth daily. 08/25/17  Yes [provider]  lisinopril (PRINIVIL,ZESTRIL) 5 MG tablet Take 1 tablet by mouth daily. 08/19/17  Yes [provider]  LYRICA 150 MG capsule Take 1 capsule by mouth 2 (two) times daily. 08/29/17  Yes [provider]  meloxicam (MOBIC) 15 MG tablet Take 15 mg by mouth daily.   Yes [provider]  metFORMIN (GLUCOPHAGE) 1000 MG tablet Take 0.5 tablets (500 mg total) by mouth 2 (two) times daily with a meal. Patient taking differently: Take 1,000 mg by mouth 2 (two) times daily with a meal.  10/01/14  Yes Holland Commons A, NP  montelukast (SINGULAIR) 10 MG tablet Take 10 mg by mouth at bedtime.   Yes [provider]  oxyCODONE-acetaminophen (PERCOCET) 10-325 MG tablet Take 1 tablet by mouth every 4 (four)  hours as needed for pain.   Yes [provider]  prazosin (MINIPRESS) 1 MG capsule Take 1 mg by mouth at bedtime. 04/25/17  Yes [provider]  tiZANidine (ZANAFLEX) 4 MG tablet Take 4 mg by mouth every 6 (six) hours as needed for muscle spasms.   Yes [provider]  zolpidem (AMBIEN CR) 12.5 MG CR tablet Take 1 tablet by mouth at bedtime. 08/25/17  Yes [provider]    Family History History reviewed. No pertinent family history.  Social History Social History   Tobacco Use  . Smoking status: Never Smoker  . Smokeless tobacco: Never Used  Substance Use Topics  . Alcohol use: No  . Drug use: No     Allergies    Hydrocodone   Review of Systems Review of Systems  All other systems reviewed and are negative.    Physical Exam Updated Vital Signs BP (!) 140/93 (BP Location: Right Arm)   Pulse 93   Temp 98.2 F (36.8 C) (Oral)   Resp 18   Ht 1.626 m (5\' 4" )   Wt 105.2 kg (232 lb)   LMP 09/15/2014   SpO2 100%   BMI 39.82 kg/m   Physical Exam  Constitutional: She appears well-developed and well-nourished. No distress.  HENT:  Head: Normocephalic and atraumatic.  Right Ear: External ear normal.  Left Ear: External ear normal.  Eyes: Conjunctivae are normal. Right eye exhibits no discharge. Left eye exhibits no discharge. No scleral icterus.  Neck: Neck supple. No tracheal deviation present.  Cardiovascular: Normal rate, regular rhythm and intact distal pulses.  Pulmonary/Chest: Effort normal and breath sounds normal. No stridor. No respiratory distress. She has no wheezes. She has no rales. She exhibits tenderness (left lower rib margin). She exhibits no edema and no swelling.  Abdominal: Soft. Bowel sounds are normal. She exhibits no distension. There is no tenderness. There is no rebound and no guarding.  Musculoskeletal: She exhibits no edema.       Left shoulder: She exhibits tenderness. She exhibits no swelling and no deformity.       Lumbar back: She exhibits tenderness. She exhibits no swelling, no edema and no deformity.  Neurological: She is alert. She has normal strength. No cranial nerve deficit (no facial droop, extraocular movements intact, no slurred speech) or sensory deficit. She exhibits normal muscle tone. She displays no seizure activity. Coordination normal.  Skin: Skin is warm and dry. No rash noted.  Psychiatric: She has a normal mood and affect.  Nursing note and vitals reviewed.    ED Treatments / Results  Labs (all labs ordered are listed, but only abnormal results are displayed) Labs Reviewed - No data to display  EKG  EKG Interpretation None        Radiology Dg Ribs Unilateral W/chest Left  Result Date: 09/15/2017 CLINICAL DATA:  Larey SeatFell out of wheelchair onto carpet yesterday. LEFT rib pain. EXAM: LEFT RIBS AND CHEST - 3+ VIEW COMPARISON:  Chest radiograph June 30, 2012 FINDINGS: No fracture or other bone lesions are seen involving the ribs. There is no evidence of pneumothorax or pleural effusion. Both lungs are clear. Heart size and mediastinal contours are within normal limits. ACDF. Surgical clips LEFT neck. Degenerative change of the thoracic spine. IMPRESSION: No acute cardiopulmonary process or rib fracture deformity. Electronically Signed   By: Awilda Metroourtnay  Bloomer M.D.   On: 09/15/2017 15:22   Dg Lumbar Spine Complete  Result Date: 09/15/2017 CLINICAL DATA:  Larey SeatFell out of wheelchair  onto carpet yesterday. Low back pain. EXAM: LUMBAR SPINE - COMPLETE 4+ VIEW COMPARISON:  Lumbar spine radiographs August 20, 2014 FINDINGS: Five non rib-bearing lumbar-type vertebral bodies are intact and aligned with maintenance of the lumbar lordosis. Severe L5-S1 disc height loss is unchanged with endplate sclerosis and marginal spurring. Remaining disc heights preserved with mild ventral endplate spurring. Sacroiliac joints are symmetric. Included prevertebral and paraspinal soft tissue planes are non-suspicious. Moderate volume retained large bowel stool. IMPRESSION: 1. No acute fracture deformity or malalignment. 2. Stable severe L5-S1 degenerative disc. 3. Moderate volume retained large bowel stool, partially imaged. Electronically Signed   By: Awilda Metroourtnay  Bloomer M.D.   On: 09/15/2017 15:23   Dg Shoulder Left  Result Date: 09/15/2017 CLINICAL DATA:  Larey SeatFell out of wheelchair onto carpet yesterday. EXAM: LEFT SHOULDER - 2+ VIEW COMPARISON:  None. FINDINGS: The humeral head is well-formed and located. The subacromial, glenohumeral and acromioclavicular joint spaces are intact. No destructive bony lesions. Soft tissue planes are non-suspicious. Surgical  clips LEFT neck. IMPRESSION: Negative. Electronically Signed   By: Awilda Metroourtnay  Bloomer M.D.   On: 09/15/2017 15:24    Procedures Procedures (including critical care time)  Medications Ordered in ED Medications - No data to display   Initial Impression / Assessment and Plan / ED Course  I have reviewed the triage vital signs and the nursing notes.  Pertinent labs & imaging results that were available during my care of the patient were reviewed by me and considered in my medical decision making (see chart for details).    No evidence of serious injury associated with the fall.  Consistent with soft tissue injury/strain.  Pt is able to walk to the bathroom. Pt can continue her outpatient pain medications.   Final Clinical Impressions(s) / ED Diagnoses   Final diagnoses:  Fall, initial encounter    ED Discharge Orders    None       Linwood DibblesKnapp, Jethro Radke, MD 09/15/17 1553

## 2018-03-01 IMAGING — DX DG RIBS W/ CHEST 3+V*L*
5 series · 5 of 5 positions shown · non-contrast
Comparison: Chest radiograph June 30, 2012

CLINICAL DATA: Fell out of wheelchair onto carpet yesterday. LEFT
rib pain.

EXAM:
LEFT RIBS AND CHEST - 3+ VIEW

[chest pa]
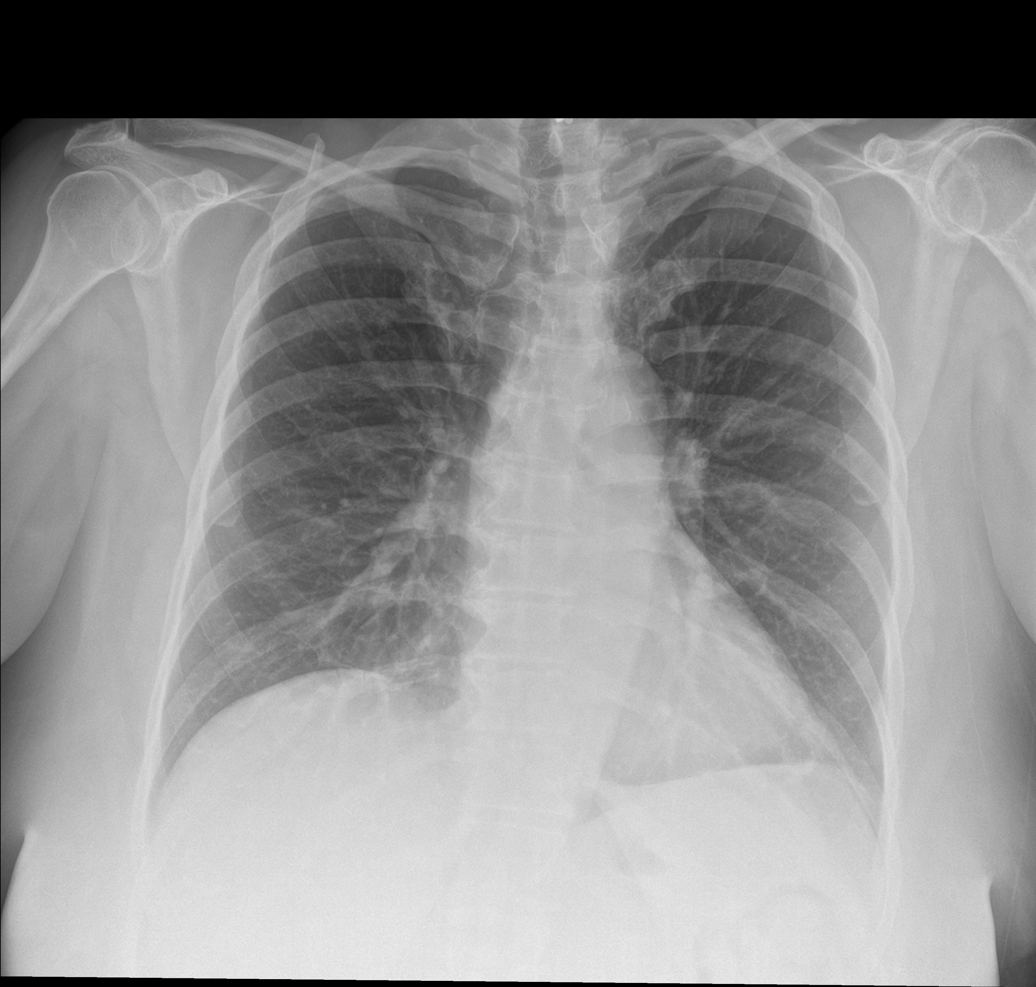

[rib pa obl (1 of 2)]
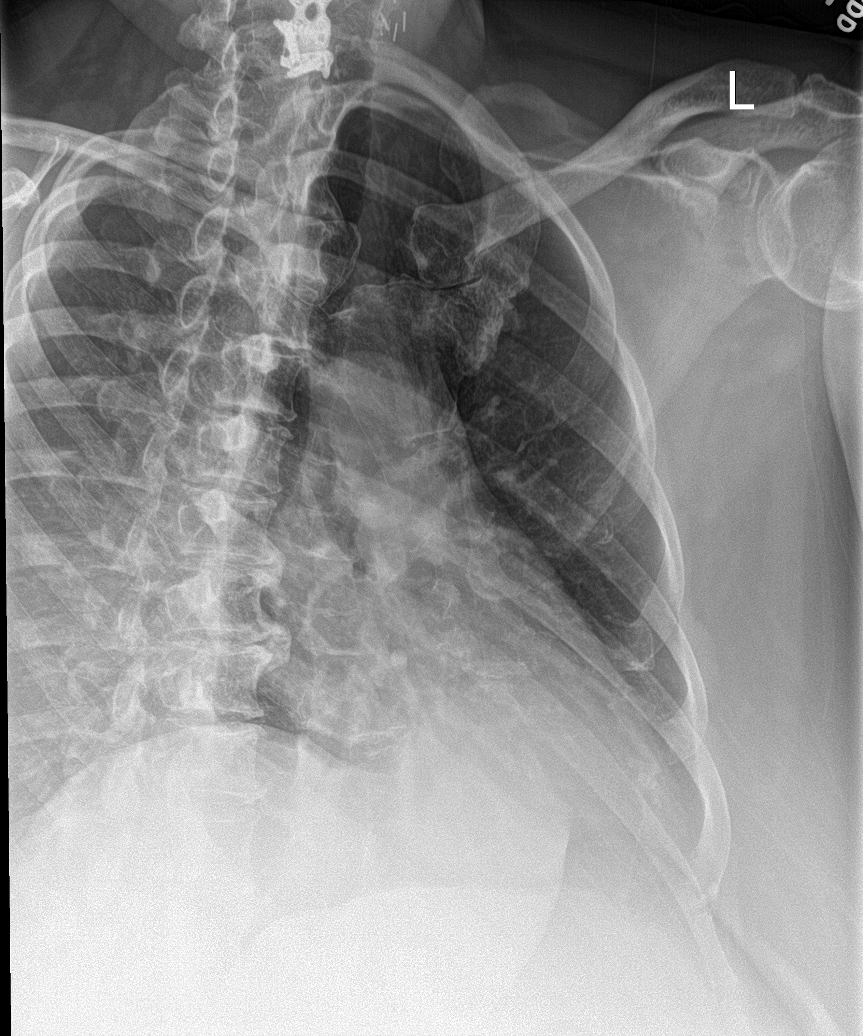

[rib pa obl (2 of 2)]
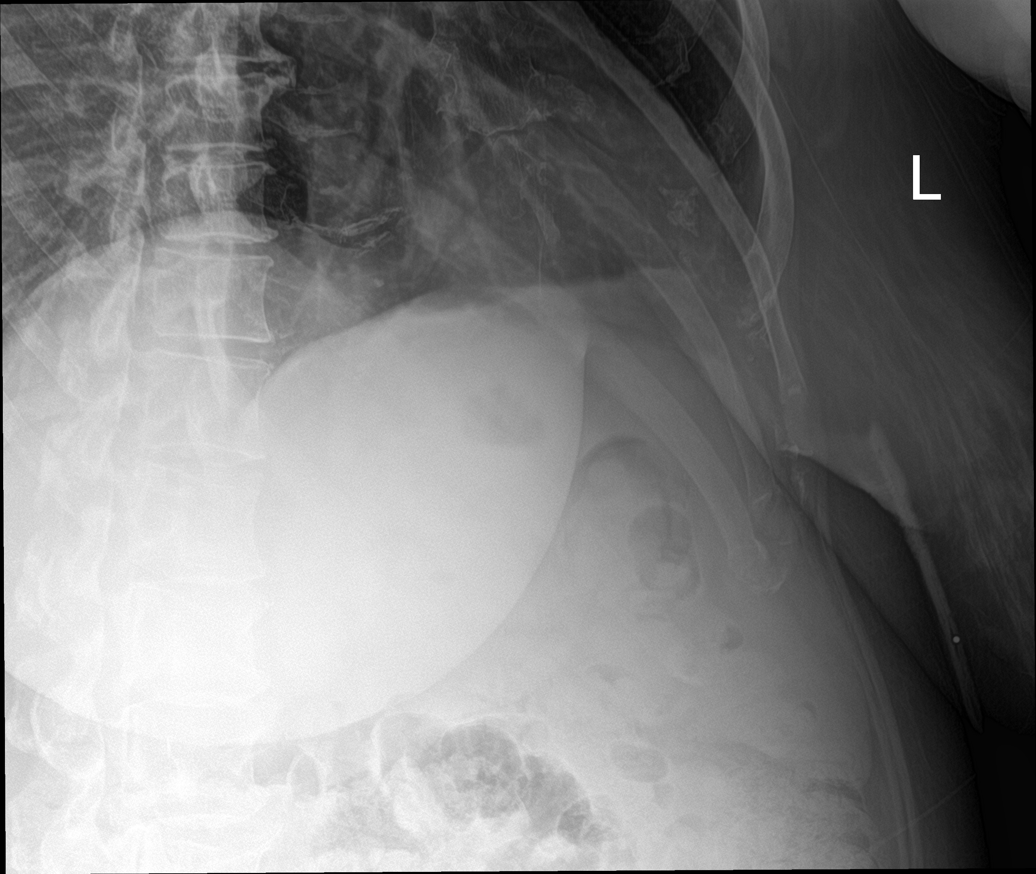

[rib pa (1 of 2)]
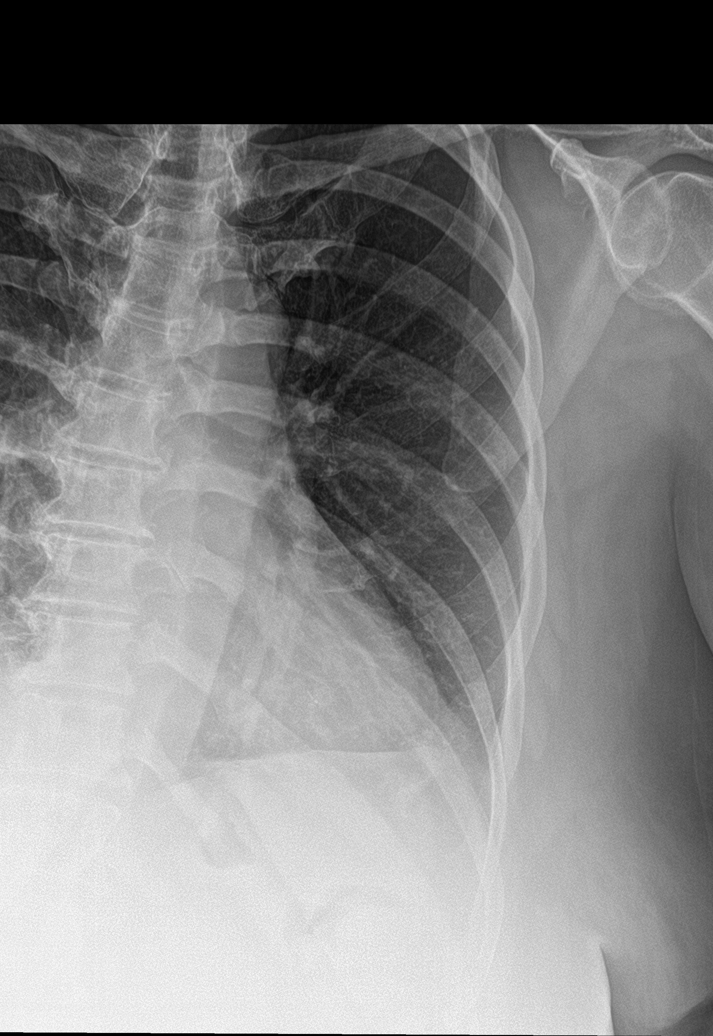

[rib pa (2 of 2)]
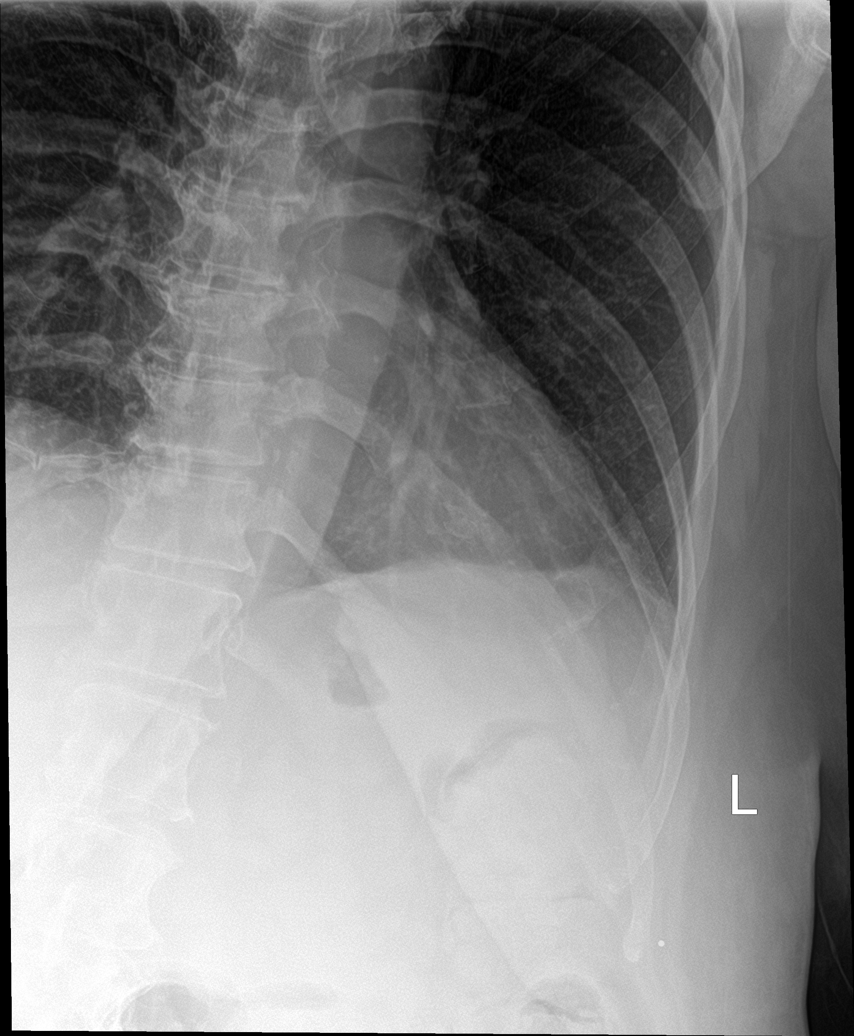

[5 of 5 positions shown; findings below may reference images not displayed]

FINDINGS: No fracture or other bone lesions are seen involving the ribs. There
is no evidence of pneumothorax or pleural effusion. Both lungs are
clear. Heart size and mediastinal contours are within normal limits.
ACDF. Surgical clips LEFT neck. Degenerative change of the thoracic
spine.
IMPRESSION: No acute cardiopulmonary process or rib fracture deformity.

## 2018-10-15 ENCOUNTER — Emergency Department (HOSPITAL_COMMUNITY)
Admission: EM | Admit: 2018-10-15 | Discharge: 2018-10-15 | Disposition: A | Discharge status: Home / Self Care | Payer: Medicaid - Out of State | Attending: Emergency Medicine | Admitting: Emergency Medicine

## 2018-10-15 DIAGNOSIS — Z79899 Other long term (current) drug therapy: Secondary | ICD-10-CM | POA: Diagnosis not present

## 2018-10-15 DIAGNOSIS — L259 Unspecified contact dermatitis, unspecified cause: Secondary | ICD-10-CM | POA: Diagnosis not present

## 2018-10-15 DIAGNOSIS — E119 Type 2 diabetes mellitus without complications: Secondary | ICD-10-CM | POA: Insufficient documentation

## 2018-10-15 DIAGNOSIS — R21 Rash and other nonspecific skin eruption: Secondary | ICD-10-CM | POA: Diagnosis present

## 2018-10-15 MED ORDER — PREDNISONE 20 MG PO TABS: 40.0000 mg | ORAL_TABLET | Freq: Every day | ORAL | 0 refills | Status: AC

## 2018-10-15 NOTE — ED Notes (Signed)
Pt discharged from ED; instructions provided and scripts given; Pt encouraged to return to ED if symptoms worsen and to f/u with PCP; Pt verbalized understanding of all instructions 

## 2018-10-15 NOTE — Discharge Instructions (Signed)
Please read the attached information.  Your rash is most likely caused by poison ivy.  Because you have groin involvement high potency topical steroids are not indicated.  Because of your severe complaints that will give you oral steroids.  As we discussed steroids will increase your blood sugar levels, given that you are diabetic this is a concern.  I have given you 40 mg daily prednisone.  I encourage you to contact your primary care provider inform him of initiation of steroids, monitor your blood sugar closely at home 3 times a day and follow-up with your primary care provider this week to monitor your blood sugar levels.  If you develop any new or worsening signs or symptoms return immediately to the emergency room.

## 2018-10-15 NOTE — ED Provider Notes (Addendum)
[facility] EMERGENCY DEPARTMENT Provider Note   CSN: [id number] Arrival date & time: 10/15/18  8110     History   Chief Complaint Chief Complaint  Patient presents with  . Rash    HPI Erin Kidd is a 53 y.o. female.  HPI   53 year old female with a past medical history of type 2 diabetes presents today with complaints of rash.  Patient notes that 1 of her friends was watching her dog, they have poison ivy in their yard.  She notes shortly after the dog returning she developed a linear itchy pustular rash to her bilateral upper extremities chest and groin.  She denies any other new exposures including food or medicine, denies any systemic symptoms.  She notes that topical ointments including calamine cream, Benadryl, and hydrocortisone have not improved her symptoms.  She notes she is unable to sleep at night due to the symptoms.  She notes that her blood sugar is fairly well controlled on oral therapy noting her ranges the mid 100s.  She notes she is down here from [place] visiting family.  Past Medical History:  Diagnosis Date  . Anxiety   . Arthritis   . Bipolar 1 disorder (HCC)   . Bursitis   . Chronic pain   . Diabetes mellitus without complication (HCC)   . Fibromyalgia   . Herniated disc   . PTSD (post-traumatic stress disorder)     Patient Active Problem List   Diagnosis Date Noted  . CAP (community acquired pneumonia) 06/26/2012  . Acute respiratory failure with hypoxia (HCC) 06/26/2012  . SVT (supraventricular tachycardia) (HCC) 06/26/2012  . Sepsis(995.91) 06/26/2012    Past Surgical History:  Procedure Laterality Date  . BACK SURGERY    . CERVICAL SPINE SURGERY    . CESAREAN SECTION    . HERNIA REPAIR    . TONSILLECTOMY       OB History   No obstetric history on file.      Home Medications    Prior to Admission medications   Medication Sig Start Date End Date Taking? Authorizing Provider  acetaminophen (TYLENOL) 500 MG  tablet Take 1,000 mg by mouth every 6 (six) hours as needed for moderate pain.    [provider]  dicyclomine (BENTYL) 10 MG capsule Take 10 mg by mouth 4 (four) times daily -  before meals and at bedtime.    [provider]  diphenhydrAMINE (BENADRYL) 25 mg capsule Take 25 mg by mouth every 6 (six) hours as needed for allergies (allergies).    [provider]  fluticasone (FLONASE) 50 MCG/ACT nasal spray Place 2 sprays into both nostrils daily.    [provider]  hydrochlorothiazide (MICROZIDE) 12.5 MG capsule Take 1 capsule (12.5 mg total) by mouth daily. 10/01/14   Ambrose Finland, NP  lamoTRIgine (LAMICTAL) 25 MG tablet Take 25 mg by mouth daily. 08/25/17   [provider]  lisinopril (PRINIVIL,ZESTRIL) 5 MG tablet Take 1 tablet by mouth daily. 08/19/17   [provider]  LYRICA 150 MG capsule Take 1 capsule by mouth 2 (two) times daily. 08/29/17   [provider]  meloxicam (MOBIC) 15 MG tablet Take 15 mg by mouth daily.    [provider]  metFORMIN (GLUCOPHAGE) 1000 MG tablet Take 0.5 tablets (500 mg total) by mouth 2 (two) times daily with a meal. Patient taking differently: Take 1,000 mg by mouth 2 (two) times daily with a meal.  10/01/14   Ambrose Finland,  NP  montelukast (SINGULAIR) 10 MG tablet Take 10 mg by mouth at bedtime.    [provider]  oxyCODONE-acetaminophen (PERCOCET) 10-325 MG tablet Take 1 tablet by mouth every 4 (four) hours as needed for pain.    [provider]  prazosin (MINIPRESS) 1 MG capsule Take 1 mg by mouth at bedtime. 04/25/17   [provider]  predniSONE (DELTASONE) 20 MG tablet Take 2 tablets (40 mg total) by mouth daily for 7 days. 10/15/18 10/22/18  Kidada Ging, Tinnie GensJeffrey, PA-C  tiZANidine (ZANAFLEX) 4 MG tablet Take 4 mg by mouth every 6 (six) hours as needed for muscle spasms.    [provider]  zolpidem (AMBIEN CR) 12.5 MG CR tablet Take 1 tablet by mouth at  bedtime. 08/25/17   [provider]    Family History No family history on file.  Social History Social History   Tobacco Use  . Smoking status: Never Smoker  . Smokeless tobacco: Never Used  Substance Use Topics  . Alcohol use: No  . Drug use: No     Allergies   Hydrocodone   Review of Systems Review of Systems  All other systems reviewed and are negative.    Physical Exam Updated Vital Signs BP (!) 128/53 (BP Location: Right Arm)   Pulse 76   Temp 98.1 F (36.7 C) (Oral)   Resp 20   LMP 09/15/2014   SpO2 96%   Physical Exam Vitals signs and nursing note reviewed.  Constitutional:      Appearance: She is well-developed.  HENT:     Head: Normocephalic and atraumatic.  Eyes:     General: No scleral icterus.       Right eye: No discharge.        Left eye: No discharge.     Conjunctiva/sclera: Conjunctivae normal.     Pupils: Pupils are equal, round, and reactive to light.  Neck:     Musculoskeletal: Normal range of motion.     Vascular: No JVD.     Trachea: No tracheal deviation.  Pulmonary:     Effort: Pulmonary effort is normal.     Breath sounds: No stridor.  Skin:    Comments: Ruptured vesicles linear in nature to the chest breasts abdomen and groin-excoriation noted  Neurological:     Mental Status: She is alert and oriented to person, place, and time.     Coordination: Coordination normal.  Psychiatric:        Behavior: Behavior normal.        Thought Content: Thought content normal.        Judgment: Judgment normal.      ED Treatments / Results  Labs (all labs ordered are listed, but only abnormal results are displayed) Labs Reviewed - No data to display  EKG None  Radiology No results found.  Procedures Procedures (including critical care time)  Medications Ordered in ED Medications - No data to display   Initial Impression / Assessment and Plan / ED Course  I have reviewed the triage vital signs and the nursing  notes.  Pertinent labs & imaging results that were available during my care of the patient were reviewed by me and considered in my medical decision making (see chart for details).     53 year old female presents today with likely poison ivy.  She has a characteristic linear rash.  She has tried over-the-counter medications without relief.  She appears very uncomfortable and has involvement in her groin.  Due to involvement  high potency topical steroids are contraindicated.  Patient is a diabetic although fairly well controlled.  I discussed risks and benefits of starting steroids, patient is adamant that she would like to take oral steroids, she will contact her primary care provider inform him of the new addition of medication, she will monitor her blood sugar closely return to the emergency room immediately if she develops any significant elevation of blood sugar or any concerning signs or symptoms.  She verbalized understanding and agreement to today's plan had no further questions or concerns.  Final Clinical Impressions(s) / ED Diagnoses   Final diagnoses:  Contact dermatitis, unspecified contact dermatitis type, unspecified trigger    ED Discharge Orders         Ordered    predniSONE (DELTASONE) 20 MG tablet  Daily     10/15/18 0832           Eyvonne Mechanic, PA-C 10/15/18 0838    Eyvonne Mechanic, PA-C 10/15/18 2883    Mancel Bale, MD 10/15/18 [phone]

## 2018-10-15 NOTE — ED Triage Notes (Signed)
Pt here with c/o poison ivy times 1 week , pt has tried several home remedies without relief

## 2018-10-23 ENCOUNTER — Emergency Department (HOSPITAL_COMMUNITY)
Admission: EM | Admit: 2018-10-23 | Discharge: 2018-10-23 | Disposition: A | Discharge status: Home / Self Care | Payer: Medicaid - Out of State | Attending: Emergency Medicine | Admitting: Emergency Medicine

## 2018-10-23 DIAGNOSIS — Z79899 Other long term (current) drug therapy: Secondary | ICD-10-CM | POA: Insufficient documentation

## 2018-10-23 DIAGNOSIS — R21 Rash and other nonspecific skin eruption: Secondary | ICD-10-CM | POA: Diagnosis present

## 2018-10-23 DIAGNOSIS — B86 Scabies: Secondary | ICD-10-CM | POA: Diagnosis not present

## 2018-10-23 MED ORDER — PERMETHRIN 5 % EX CREA: TOPICAL_CREAM | CUTANEOUS | 1 refills | Status: AC

## 2018-10-23 NOTE — Discharge Instructions (Signed)
Return if any problems.

## 2018-10-23 NOTE — ED Triage Notes (Signed)
Pt endorses generalized rash x 3 weeks. Seen here previously for same and placed on prednisone but rash has not gone away completely. Pt also states that she has ran a mild fever at home. VSS.

## 2018-10-23 NOTE — ED Provider Notes (Signed)
[facility] EMERGENCY DEPARTMENT Provider Note   CSN: [id number] Arrival date & time: 10/23/18  0900     History   Chief Complaint Chief Complaint  Patient presents with  . Rash    HPI Erin Kidd is a 53 y.o. female.  The history is provided by the patient. No language interpreter was used.  Rash  Location:  Full body Quality: itchiness and redness   Severity:  Moderate Onset quality:  Gradual Duration:  3 weeks Timing:  Constant Progression:  Worsening Relieved by:  Antihistamines Worsened by:  Nothing Ineffective treatments:  Antihistamines and topical steroids  Pt finished prednisone with no relief  Past Medical History:  Diagnosis Date  . Anxiety   . Arthritis   . Bipolar 1 disorder (HCC)   . Bursitis   . Chronic pain   . Diabetes mellitus without complication (HCC)   . Fibromyalgia   . Herniated disc   . PTSD (post-traumatic stress disorder)     Patient Active Problem List   Diagnosis Date Noted  . CAP (community acquired pneumonia) 06/26/2012  . Acute respiratory failure with hypoxia (HCC) 06/26/2012  . SVT (supraventricular tachycardia) (HCC) 06/26/2012  . Sepsis(995.91) 06/26/2012    Past Surgical History:  Procedure Laterality Date  . BACK SURGERY    . CERVICAL SPINE SURGERY    . CESAREAN SECTION    . HERNIA REPAIR    . TONSILLECTOMY       OB History   No obstetric history on file.      Home Medications    Prior to Admission medications   Medication Sig Start Date End Date Taking? Authorizing Provider  acetaminophen (TYLENOL) 500 MG tablet Take 1,000 mg by mouth every 6 (six) hours as needed for moderate pain.    [provider]  dicyclomine (BENTYL) 10 MG capsule Take 10 mg by mouth 4 (four) times daily -  before meals and at bedtime.    [provider]  diphenhydrAMINE (BENADRYL) 25 mg capsule Take 25 mg by mouth every 6 (six) hours as needed for allergies (allergies).    [provider]   fluticasone (FLONASE) 50 MCG/ACT nasal spray Place 2 sprays into both nostrils daily.    [provider]  hydrochlorothiazide (MICROZIDE) 12.5 MG capsule Take 1 capsule (12.5 mg total) by mouth daily. 10/01/14   Ambrose Finland, NP  lamoTRIgine (LAMICTAL) 25 MG tablet Take 25 mg by mouth daily. 08/25/17   [provider]  lisinopril (PRINIVIL,ZESTRIL) 5 MG tablet Take 1 tablet by mouth daily. 08/19/17   [provider]  LYRICA 150 MG capsule Take 1 capsule by mouth 2 (two) times daily. 08/29/17   [provider]  meloxicam (MOBIC) 15 MG tablet Take 15 mg by mouth daily.    [provider]  montelukast (SINGULAIR) 10 MG tablet Take 10 mg by mouth at bedtime.    [provider]  oxyCODONE-acetaminophen (PERCOCET) 10-325 MG tablet Take 1 tablet by mouth every 4 (four) hours as needed for pain.    [provider]  permethrin (ELIMITE) 5 % cream Apply to affected area once 10/23/18   Elson Areas, PA-C  prazosin (MINIPRESS) 1 MG capsule Take 1 mg by mouth at bedtime. 04/25/17   [provider]  tiZANidine (ZANAFLEX) 4 MG tablet Take 4 mg by mouth every 6 (six) hours as needed for muscle spasms.    [provider]  zolpidem (AMBIEN CR) 12.5 MG CR tablet Take 1 tablet  by mouth at bedtime. 08/25/17   [provider]    Family History No family history on file.  Social History Social History   Tobacco Use  . Smoking status: Never Smoker  . Smokeless tobacco: Never Used  Substance Use Topics  . Alcohol use: No  . Drug use: No     Allergies   Hydrocodone   Review of Systems Review of Systems  Skin: Positive for rash.  All other systems reviewed and are negative.    Physical Exam Updated Vital Signs BP (!) 129/53 (BP Location: Left Arm)   Pulse 87   Temp 97.8 F (36.6 C) (Oral)   Resp 18   LMP 09/15/2014   SpO2 100%   Physical Exam Vitals signs and nursing note reviewed.  Constitutional:       Appearance: Normal appearance. She is normal weight.  HENT:     Right Ear: Tympanic membrane normal.     Left Ear: Tympanic membrane normal.     Mouth/Throat:     Pharynx: Oropharynx is clear.  Eyes:     Pupils: Pupils are equal, round, and reactive to light.  Neck:     Musculoskeletal: Normal range of motion.  Cardiovascular:     Rate and Rhythm: Normal rate.  Pulmonary:     Effort: Pulmonary effort is normal.  Abdominal:     General: Abdomen is flat.  Skin:    Findings: Rash present.     Comments: Burrows between finger,  Multiple sores around umbilicus   Neurological:     General: No focal deficit present.     Mental Status: She is alert.  Psychiatric:        Mood and Affect: Mood normal.      ED Treatments / Results  Labs (all labs ordered are listed, but only abnormal results are displayed) Labs Reviewed - No data to display  EKG None  Radiology No results found.  Procedures Procedures (including critical care time)  Medications Ordered in ED Medications - No data to display   Initial Impression / Assessment and Plan / ED Course  I have reviewed the triage vital signs and the nursing notes.  Pertinent labs & imaging results that were available during my care of the patient were reviewed by me and considered in my medical decision making (see chart for details).      Final Clinical Impressions(s) / ED Diagnoses   Final diagnoses:  Scabies    ED Discharge Orders         Ordered    permethrin (ELIMITE) 5 % cream     10/23/18 1013        An After Visit Summary was printed and given to the patient.    Elson Areas, [place] 10/23/18 1017    Arby Barrette, MD 11/01/18 1715

## 2018-11-09 ENCOUNTER — Other Ambulatory Visit: Payer: Self-pay

## 2018-11-09 ENCOUNTER — Emergency Department (HOSPITAL_COMMUNITY)
Admission: EM | Admit: 2018-11-09 | Discharge: 2018-11-09 | Disposition: A | Discharge status: Home / Self Care | Payer: Medicaid - Out of State | Attending: Emergency Medicine | Admitting: Emergency Medicine

## 2018-11-09 ENCOUNTER — Encounter (HOSPITAL_COMMUNITY): Payer: Self-pay

## 2018-11-09 DIAGNOSIS — E119 Type 2 diabetes mellitus without complications: Secondary | ICD-10-CM | POA: Diagnosis not present

## 2018-11-09 DIAGNOSIS — B86 Scabies: Secondary | ICD-10-CM | POA: Diagnosis present

## 2018-11-09 DIAGNOSIS — R21 Rash and other nonspecific skin eruption: Secondary | ICD-10-CM | POA: Insufficient documentation

## 2018-11-09 DIAGNOSIS — Z79899 Other long term (current) drug therapy: Secondary | ICD-10-CM | POA: Insufficient documentation

## 2018-11-09 MED ORDER — HYDROXYZINE HCL 25 MG PO TABS
25.0000 mg | ORAL_TABLET | Freq: Once | ORAL | Status: DC
  Filled 2018-11-09: qty 1

## 2018-11-09 MED ORDER — CARRINGTON MOISTURE BARRIER EX CREA: TOPICAL_CREAM | CUTANEOUS | 0 refills | Status: AC | PRN

## 2018-11-09 MED ORDER — HYDROXYZINE HCL 25 MG PO TABS: 25.0000 mg | ORAL_TABLET | Freq: Four times a day (QID) | ORAL | 0 refills | Status: AC

## 2018-11-09 NOTE — ED Triage Notes (Signed)
Pt states that she has been seen 3x for scabies already. Pt states that she has been using the cream prescribed to her, but is still dealing with the scabies. Pt tearful in triage.

## 2018-11-09 NOTE — ED Provider Notes (Signed)
[facility]-EMERGENCY DEPT Provider Note   CSN: [id number] Arrival date & time: 11/09/18  1559     History   Chief Complaint Chief Complaint  Patient presents with  . Scabies    HPI Erin Kidd is a 53 y.o. female who presents to the ED with a rash. Patient reports being seen and treated x3 for scabies. Patient reports using the cream as prescribed but still dealing with the problem. Patient states she went to an Urgent Care in [place], then [facility] x2 and now here. In review of the patient's previous visits on 10/15/2018 patient was evaluated for probably poison ivy after being in contact with dogs that had been out in the woods and in contact with the plant. On patient's 10/23/2018 visit she had developed burrows between the fingers and had multiple sores to the abdomen and was treated for scabies.   HPI  Past Medical History:  Diagnosis Date  . Anxiety   . Arthritis   . Bipolar 1 disorder (HCC)   . Bursitis   . Chronic pain   . Diabetes mellitus without complication (HCC)   . Fibromyalgia   . Herniated disc   . PTSD (post-traumatic stress disorder)     Patient Active Problem List   Diagnosis Date Noted  . CAP (community acquired pneumonia) 06/26/2012  . Acute respiratory failure with hypoxia (HCC) 06/26/2012  . SVT (supraventricular tachycardia) (HCC) 06/26/2012  . Sepsis(995.91) 06/26/2012    Past Surgical History:  Procedure Laterality Date  . BACK SURGERY    . CERVICAL SPINE SURGERY    . CESAREAN SECTION    . HERNIA REPAIR    . TONSILLECTOMY       OB History   No obstetric history on file.      Home Medications    Prior to Admission medications   Medication Sig Start Date End Date Taking? Authorizing Provider  acetaminophen (TYLENOL) 500 MG tablet Take 1,000 mg by mouth every 6 (six) hours as needed for moderate pain.    [provider]  dicyclomine (BENTYL) 10 MG capsule Take 10 mg by mouth 4 (four) times daily -  before meals and  at bedtime.    [provider]  fluticasone (FLONASE) 50 MCG/ACT nasal spray Place 2 sprays into both nostrils daily.    [provider]  hydrochlorothiazide (MICROZIDE) 12.5 MG capsule Take 1 capsule (12.5 mg total) by mouth daily. 10/01/14   Ambrose Finland, NP  hydrOXYzine (ATARAX/VISTARIL) 25 MG tablet Take 1 tablet (25 mg total) by mouth every 6 (six) hours. 11/09/18   Janne Napoleon, NP  lamoTRIgine (LAMICTAL) 25 MG tablet Take 25 mg by mouth daily. 08/25/17   [provider]  lisinopril (PRINIVIL,ZESTRIL) 5 MG tablet Take 1 tablet by mouth daily. 08/19/17   [provider]  LYRICA 150 MG capsule Take 1 capsule by mouth 2 (two) times daily. 08/29/17   [provider]  meloxicam (MOBIC) 15 MG tablet Take 15 mg by mouth daily.    [provider]  montelukast (SINGULAIR) 10 MG tablet Take 10 mg by mouth at bedtime.    [provider]  oxyCODONE-acetaminophen (PERCOCET) 10-325 MG tablet Take 1 tablet by mouth every 4 (four) hours as needed for pain.    [provider]  permethrin (ELIMITE) 5 % cream Apply to affected area once 10/23/18   Elson Areas, PA-C  prazosin (MINIPRESS) 1 MG capsule Take 1 mg by mouth at bedtime. 04/25/17  [provider]  Skin Protectants, Misc. (EUCERIN) cream Apply topically as needed for wound care. 11/09/18   Janne Napoleon, NP  tiZANidine (ZANAFLEX) 4 MG tablet Take 4 mg by mouth every 6 (six) hours as needed for muscle spasms.    [provider]  zolpidem (AMBIEN CR) 12.5 MG CR tablet Take 1 tablet by mouth at bedtime. 08/25/17   [provider]    Family History No family history on file.  Social History Social History   Tobacco Use  . Smoking status: Never Smoker  . Smokeless tobacco: Never Used  Substance Use Topics  . Alcohol use: No  . Drug use: No     Allergies   Hydrocodone   Review of Systems Review of Systems  Skin: Positive for rash and  wound.       itching  All other systems reviewed and are negative.    Physical Exam Updated Vital Signs BP 124/61   Pulse 72   Temp 97.9 F (36.6 C) (Oral)   Resp 18   Ht 5\' 4"  (1.626 m)   Wt 105 kg   LMP 09/15/2014   SpO2 100%   BMI 39.73 kg/m   Physical Exam Vitals signs and nursing note reviewed.  Constitutional:      General: She is not in acute distress.    Appearance: She is well-developed. She is obese.  HENT:     Head: Normocephalic.     Mouth/Throat:     Mouth: Mucous membranes are moist.  Eyes:     Conjunctiva/sclera: Conjunctivae normal.  Neck:     Musculoskeletal: Neck supple.  Cardiovascular:     Rate and Rhythm: Normal rate.  Pulmonary:     Effort: Pulmonary effort is normal.  Abdominal:     Comments: See skin exam  Musculoskeletal: Normal range of motion.  Skin:    General: Skin is warm and dry.     Findings: Rash present.     Comments: Multiple lesions generalized. Rash on breast appear as dry scabbing areas where patient has scratched. Similar areas on abdomen. Rash on hands and legs are smaller. No drainage or signs of infection.   Neurological:     Mental Status: She is alert and oriented to person, place, and time.      ED Treatments / Results  Labs (all labs ordered are listed, but only abnormal results are displayed) Labs Reviewed - No data to display  Radiology No results found.  Procedures Procedures (including critical care time)  Medications Ordered in ED Medications - No data to display  Dr. Charm Barges in to see the patient and suggested f/u with dermatology for further evaluation. Will give patient Rx Eucerin cream and medication for itching. Stable for d/c without fever and does not appear toxic.   Initial Impression / Assessment and Plan / ED Course  I have reviewed the triage vital signs and the nursing notes.   Final Clinical Impressions(s) / ED Diagnoses   Final diagnoses:  Rash and nonspecific skin eruption     ED Discharge Orders         Ordered    hydrOXYzine (ATARAX/VISTARIL) 25 MG tablet  Every 6 hours     11/09/18 1725    Skin Protectants, Misc. (EUCERIN) cream  As needed     11/09/18 1725           Kerrie Buffalo [place], [place] 11/09/18 2203    Terrilee Files, MD 11/09/18 2322

## 2018-11-09 NOTE — Discharge Instructions (Addendum)
The medication for itching can make you sleepy. Use the cream as needed to help moisturize the skin.

## 2020-10-26 NOTE — Progress Notes (Signed)
New Patient Note  RE: Erin LambLaura Kidd MRN: [id number] DOB: 12/07/1965 Date of Office Visit: 10/27/2020  Referring provider: Remus LofflerJones, Angel S, PA-C Primary care provider: Patient, No Pcp Per  Chief Complaint: Sinus Problem (allergies)  History of Present Illness: I had the pleasure of seeing Erin LambLaura Kidd for initial evaluation at the [facility] on 10/27/2020. She is a 55 y.o. female, who is referred here by PCP for the evaluation of allergic rhinitis.  She reports symptoms of sinus pressure, nasal congestion, rhinorrhea, PND, sneezing, itchy nose, itchy/watery eyes. Symptoms have been going on for 20 years. The symptoms are present all year around with worsening in winter. Anosmia: no. Headache: yes. She has used Singulair, zyrtec, allegra, mucinex, benadryl, Flonase with minimal improvement in symptoms. Sinus infections: 6-7 per year and has tried various antibiotics. Previous work up includes: 20 years ago which was positive to dust per patient report. No previous allergy injections.  Previous ENT evaluation: no. Previous sinus imaging: not recently.   10/18/2008 CT sinus: No evidence of acute or chronic sinusitis.  History of nasal polyps: no. Last eye exam: 5 years ago. History of reflux: no.  Assessment and Plan: Erin RiegerLaura is a 55 y.o. female with: Chronic rhinitis Perennial rhinoconjunctivitis symptoms for 20 years with worsening in the winter with frequent sinusitis.  Skin testing 20 years ago was positive to dust mites per patient report.  No previous allergy injections.  No prior ENT evaluation.  Today's skin testing showed: Negative to indoor/outdoor allergens.   Start nasal saline lavage (i.e., NeilMed) 1-2 times a day.  May use Flonase (fluticasone) nasal spray 1 spray per nostril twice a day as needed for nasal congestion.   May use azelastine nasal spray 1-2 sprays per nostril twice a day as needed for runny nose/drainage.  Will add on environmental allergy  panel to immune evaluation bloodwork.  If the bloodwork does not show any allergies then will refer to ENT next.   Frequent sinus infections 6-7 sinus infections per year requiring antibiotics. 1 episode of pneumonia in 2014 requiring hospitalization.  Takes medications for diabetes.  Keep track of infections.  Get basic bloodwork to look at immune system.  H/O bronchitis History of frequent bronchitis in the past.  No prior asthma diagnosis.  Today's spirometry showed some restriction most likely due to body habitus.  Return in about 3 months (around 01/25/2021).  Meds ordered this encounter  Medications  . fluticasone (FLONASE) 50 MCG/ACT nasal spray    Sig: Place 1 spray into both nostrils 2 (two) times daily as needed (nasal congestion).    Dispense:  16 g    Refill:  5  . azelastine (ASTELIN) 0.1 % nasal spray    Sig: Place 1-2 sprays into both nostrils 2 (two) times daily as needed (runny nose/drainage). Use in each nostril as directed    Dispense:  30 mL    Refill:  5    Lab Orders     CBC with Differential/Platelet     Comprehensive metabolic panel     Complement, total     Strep pneumoniae 23 Serotypes IgG     Diphtheria / Tetanus Antibody Panel     IgG, IgA, IgM     Allergens w/Total IgE Area 2  Other allergy screening: Asthma: no Food allergy: no Medication allergy: yes  Hydrocodone - itching, nausea/vomiting Hymenoptera allergy: not sure Urticaria: yes when stressed.  Eczema: yes and going to see dermatology. History of recurrent infections suggestive  of immunodeficency:   Patient has history multiple infections including sinus infection, pneumonia and hospitalized in 2014. Denies any ear infections, GI infections/diarrhea, skin infections/abscesses. Patient has no history of opportunistic infections including fungal infections, viral infections.  Patient reports 5-6 antibiotic use in the last 12 months and 1 hospital admission in 2014. Patient does not have  any secondary causes of immunodeficiency including chronic steroid use, protein losing enteropathy, renal or hepatic dysfunction, history of cancer or irradiation or history of HIV, hepatitis B or C.  Patient has diabetes.  Diagnostics: Spirometry:  Tracings reviewed. Her effort: Good reproducible efforts. FVC: 2.45L FEV1: 2.12L, 86% predicted FEV1/FVC ratio: 87% Interpretation: Spirometry consistent with possible restrictive disease.  Please see scanned spirometry results for details.  Skin Testing: Environmental allergy panel. Negative to indoor/outdoor allergens.  Results discussed with patient/family.  Airborne Adult Perc - 10/27/20 1424    Time Antigen Placed 1424    Allergen Manufacturer Waynette ButteryGreer    Location Back    Number of Test 59    1. Control-Buffer 50% Glycerol Negative    2. Control-Histamine 1 mg/ml 2+    4. Bahia Negative    5. French Southern Territories[place]ermuda Negative    6. Johnson Negative    7. Kentucky Blue Negative    8. Meadow Fescue Negative    9. Perennial Rye Negative    10. Sweet Vernal Negative    11. Timothy Negative    12. Cocklebur Negative    13. Burweed Marshelder Negative    14. Ragweed, short Negative    15. Ragweed, Giant Negative    16. Plantain,  English Negative    17. Kidd's Quarters Negative    18. Sheep Sorrell Negative    19. Rough Pigweed Negative    20. Marsh Elder, Rough Negative    21. Mugwort, Common Negative    22. Ash mix Negative    23. Birch mix Negative    24. Beech American Negative    25. Box, Elder Negative    26. Cedar, red Negative    27. Cottonwood, Guinea-BissauEastern Negative    28. Elm mix Negative    29. Hickory Negative    30. Maple mix Negative    31. Oak, Guinea-Bissau[place]astern mix Negative    32. Pecan Pollen Negative    33. Pine mix Negative    34. Sycamore Eastern Negative    35. Walnut, Black Pollen Negative    36. Alternaria alternata Negative    37. Cladosporium Herbarum Negative    38. Aspergillus mix Negative    39. Penicillium mix Negative     40. Bipolaris sorokiniana (Helminthosporium) Negative    41. Drechslera spicifera (Curvularia) Negative    42. Mucor plumbeus Negative    43. Fusarium moniliforme Negative    44. Aureobasidium pullulans (pullulara) Negative    45. Rhizopus oryzae Negative    46. Botrytis cinera Negative    47. Epicoccum nigrum Negative    48. Phoma betae Negative    49. Candida Albicans Negative    50. Trichophyton mentagrophytes Negative    51. Mite, D Farinae  5,000 AU/ml Negative    52. Mite, D Pteronyssinus  5,000 AU/ml Negative    53. Cat Hair 10,000 BAU/ml Negative    54.  Dog Epithelia Negative    55. Mixed Feathers Negative    56. Horse Epithelia Negative    57. Cockroach, German Negative    58. Mouse Negative    59. Tobacco Leaf Negative  Past Medical History: Patient Active Problem List   Diagnosis Date Noted  . Chronic rhinitis 10/27/2020  . Frequent sinus infections 10/27/2020  . H/O bronchitis 10/27/2020  . CAP (community acquired pneumonia) 06/26/2012  . Acute respiratory failure with hypoxia (HCC) 06/26/2012  . SVT (supraventricular tachycardia) (HCC) 06/26/2012  . Sepsis(995.91) 06/26/2012   Past Medical History:  Diagnosis Date  . Anxiety   . Arthritis   . Bipolar 1 disorder (HCC)   . Bursitis   . Chronic pain   . Diabetes mellitus without complication (HCC)   . Eczema   . Fibromyalgia   . Herniated disc   . PTSD (post-traumatic stress disorder)    Past Surgical History: Past Surgical History:  Procedure Laterality Date  . BACK SURGERY    . CERVICAL SPINE SURGERY    . CESAREAN SECTION    . HERNIA REPAIR    . TONSILLECTOMY     Medication List:  Current Outpatient Medications  Medication Sig Dispense Refill  . acetaminophen (TYLENOL) 500 MG tablet Take 1,000 mg by mouth every 6 (six) hours as needed for moderate pain.    Marland Kitchen aspirin 81 MG chewable tablet Chew 1 tablet by mouth daily.    Marland Kitchen atorvastatin (LIPITOR) 40 MG tablet Take 1 tablet by mouth  daily.    Marland Kitchen azelastine (ASTELIN) 0.1 % nasal spray Place 1-2 sprays into both nostrils 2 (two) times daily as needed (runny nose/drainage). Use in each nostril as directed 30 mL 5  . calcium carbonate (OSCAL) 1500 (600 Ca) MG TABS tablet Take 1 tablet by mouth daily.    . Cholecalciferol 250 MCG (10000 UT) CAPS Take by mouth.    . dicyclomine (BENTYL) 10 MG capsule Take 10 mg by mouth 4 (four) times daily -  before meals and at bedtime.    . dicyclomine (BENTYL) 20 MG tablet TAKE 1/2 TABLET BY MOUTH FOUR TIMES A DAY    . Empagliflozin-metFORMIN HCl (SYNJARDY) 12.5-500 MG TABS Take 1 tablet by mouth 2 (two) times daily.    . fexofenadine (ALLEGRA) 180 MG tablet Take 1 tablet by mouth daily.    . fluticasone (FLONASE) 50 MCG/ACT nasal spray Place 1 spray into both nostrils 2 (two) times daily as needed (nasal congestion). 16 g 5  . glucose blood (PRECISION QID TEST) test strip 1 Strip by external route every day    . hydrochlorothiazide (MICROZIDE) 12.5 MG capsule Take 1 capsule (12.5 mg total) by mouth daily. 30 capsule 4  . hydrOXYzine (ATARAX/VISTARIL) 25 MG tablet Take 1 tablet (25 mg total) by mouth every 6 (six) hours. 20 tablet 0  . lamoTRIgine (LAMICTAL) 100 MG tablet Take 1 tablet by mouth daily.    Marland Kitchen lamoTRIgine (LAMICTAL) 25 MG tablet Take 25 mg by mouth daily.    Marland Kitchen lisinopril (PRINIVIL,ZESTRIL) 5 MG tablet Take 1 tablet by mouth daily.  11  . lisinopril-hydrochlorothiazide (ZESTORETIC) 10-12.5 MG tablet Take 1 tablet by mouth daily.    Marland Kitchen LYRICA 150 MG capsule Take 1 capsule by mouth 2 (two) times daily.    . meloxicam (MOBIC) 15 MG tablet Take 15 mg by mouth daily.    . montelukast (SINGULAIR) 10 MG tablet Take 10 mg by mouth at bedtime.    Marland Kitchen oxyCODONE-acetaminophen (PERCOCET) 10-325 MG tablet Take 1 tablet by mouth every 4 (four) hours as needed for pain.    Marland Kitchen permethrin (ELIMITE) 5 % cream Apply to affected area once 60 g 1  . prazosin (MINIPRESS) 1 MG capsule  Take 1 mg by mouth at  bedtime.    . prazosin (MINIPRESS) 1 MG capsule Take 1 capsule by mouth every evening.    . Skin Protectants, Misc. (EUCERIN) cream Apply topically as needed for wound care. 397 g 0  . tiZANidine (ZANAFLEX) 4 MG tablet Take 4 mg by mouth every 6 (six) hours as needed for muscle spasms.    Marland Kitchen tiZANidine (ZANAFLEX) 4 MG tablet Take 1 tablet by mouth 2 (two) times daily.    Marland Kitchen zolpidem (AMBIEN CR) 12.5 MG CR tablet Take 1 tablet by mouth at bedtime.    Marland Kitchen zolpidem (AMBIEN) 10 MG tablet TAKE ONE TABLET BY MOUTH EVERY NIGHT AT BEDTIME AS NEEDED FOR SLEEP     No current facility-administered medications for this visit.   Allergies: Allergies  Allergen Reactions  . Hydrocodone Hives and Nausea And Vomiting   Social History: Social History   Socioeconomic History  . Marital status: Divorced    Spouse name: Not on file  . Number of children: Not on file  . Years of education: Not on file  . Highest education level: Not on file  Occupational History  . Not on file  Tobacco Use  . Smoking status: Never Smoker  . Smokeless tobacco: Never Used  Substance and Sexual Activity  . Alcohol use: No  . Drug use: No  . Sexual activity: Never  Other Topics Concern  . Not on file  Social History Narrative  . Not on file   Social Determinants of Health   Financial Resource Strain: Not on file  Food Insecurity: Not on file  Transportation Needs: Not on file  Physical Activity: Not on file  Stress: Not on file  Social Connections: Not on file   Lives in a 57 year old house. Smoking: quit in 2009 Occupation: unemployed  Environmental History: Water Damage/mildew in the house: no Engineer, civil (consulting) in the family room: no Carpet in the bedroom: no Heating: gas Cooling: central Pet: 1 dog, 2 cats, chicken - all outdoors  Family History: Family History  Problem Relation Age of Onset  . Asthma Brother    Review of Systems  Constitutional: Negative for appetite change, chills, fever and unexpected  weight change.  HENT: Positive for congestion, postnasal drip, rhinorrhea, sinus pressure and sinus pain.   Eyes: Negative for itching.  Respiratory: Negative for cough, chest tightness, shortness of breath and wheezing.   Cardiovascular: Negative for chest pain.  Gastrointestinal: Negative for abdominal pain.  Genitourinary: Negative for difficulty urinating.  Skin: Negative for rash.  Neurological: Positive for headaches.   Objective: BP 112/70 (BP Location: Left Arm, Patient Position: Sitting, Cuff Size: Large)   Pulse 79   Temp (!) 91.6 F (33.1 C) (Temporal)   Resp 18   Ht 5' 3.78" (1.62 m)   Wt 239 lb 14.4 oz (108.8 kg)   LMP 09/15/2014   SpO2 95%   BMI 41.46 kg/m  Body mass index is 41.46 kg/m. Physical Exam Vitals and nursing note reviewed.  Constitutional:      Appearance: Normal appearance. She is well-developed. She is obese.  HENT:     Head: Normocephalic and atraumatic.     Right Ear: Tympanic membrane and external ear normal.     Left Ear: Tympanic membrane and external ear normal.     Nose: Nose normal.     Mouth/Throat:     Mouth: Mucous membranes are moist.     Pharynx: Oropharynx is clear.     Comments:  edentulous Eyes:     Conjunctiva/sclera: Conjunctivae normal.  Cardiovascular:     Rate and Rhythm: Normal rate and regular rhythm.     Heart sounds: Normal heart sounds. No murmur heard. No friction rub. No gallop.   Pulmonary:     Effort: Pulmonary effort is normal.     Breath sounds: Normal breath sounds. No wheezing, rhonchi or rales.  Abdominal:     Palpations: Abdomen is soft.  Musculoskeletal:     Cervical back: Neck supple.  Skin:    General: Skin is warm.     Findings: No rash.  Neurological:     Mental Status: She is alert and oriented to person, place, and time.  Psychiatric:        Behavior: Behavior normal.    The plan was reviewed with the patient/family, and all questions/concerned were addressed.  It was my pleasure to see  Erin Kidd today and participate in her care. Please feel free to contact me with any questions or concerns.  Sincerely,  Wyline Mood, DO Allergy & Immunology  [facility] office: [phone] [facility] office: [phone]

## 2020-10-27 ENCOUNTER — Other Ambulatory Visit: Payer: Self-pay

## 2020-10-27 ENCOUNTER — Encounter: Payer: Self-pay | Admitting: Allergy

## 2020-10-27 ENCOUNTER — Ambulatory Visit (INDEPENDENT_AMBULATORY_CARE_PROVIDER_SITE_OTHER): Payer: Medicaid Other | Admitting: Allergy

## 2020-10-27 VITALS — BP 112/70 | HR 79 | Temp 91.6°F | Resp 18 | Ht 63.78 in | Wt 239.9 lb

## 2020-10-27 DIAGNOSIS — Z8709 Personal history of other diseases of the respiratory system: Secondary | ICD-10-CM | POA: Diagnosis not present

## 2020-10-27 DIAGNOSIS — J310 Chronic rhinitis: Secondary | ICD-10-CM

## 2020-10-27 DIAGNOSIS — J329 Chronic sinusitis, unspecified: Secondary | ICD-10-CM

## 2020-10-27 MED ORDER — FLUTICASONE PROPIONATE 50 MCG/ACT NA SUSP: 1.0000 | Freq: Two times a day (BID) | NASAL | 5 refills | Status: AC | PRN

## 2020-10-27 MED ORDER — AZELASTINE HCL 0.1 % NA SOLN: 1.0000 | Freq: Two times a day (BID) | NASAL | 5 refills | Status: AC | PRN

## 2020-10-27 NOTE — Assessment & Plan Note (Signed)
6-7 sinus infections per year requiring antibiotics. 1 episode of pneumonia in 2014 requiring hospitalization.  Takes medications for diabetes.  Keep track of infections.  Get basic bloodwork to look at immune system.

## 2020-10-27 NOTE — Assessment & Plan Note (Signed)
History of frequent bronchitis in the past.  No prior asthma diagnosis.  Today's spirometry showed some restriction most likely due to body habitus.

## 2020-10-27 NOTE — Assessment & Plan Note (Signed)
Perennial rhinoconjunctivitis symptoms for 20 years with worsening in the winter with frequent sinusitis.  Skin testing 20 years ago was positive to dust mites per patient report.  No previous allergy injections.  No prior ENT evaluation.  Today's skin testing showed: Negative to indoor/outdoor allergens.   Start nasal saline lavage (i.e., NeilMed) 1-2 times a day.  May use Flonase (fluticasone) nasal spray 1 spray per nostril twice a day as needed for nasal congestion.   May use azelastine nasal spray 1-2 sprays per nostril twice a day as needed for runny nose/drainage.  Will add on environmental allergy panel to immune evaluation bloodwork.  If the bloodwork does not show any allergies then will refer to ENT next.

## 2020-10-27 NOTE — Patient Instructions (Addendum)
Today's skin testing showed: Negative to indoor/outdoor allergens.   Today's breathing test showed some mild restriction most likely due to body habitus.   Chronic rhinitis  Start nasal saline lavage (i.e., NeilMed) 1-2 times a day.  May use Flonase (fluticasone) nasal spray 1 spray per nostril twice a day as needed for nasal congestion.   May use azelastine nasal spray 1-2 sprays per nostril twice a day as needed for runny nose/drainage.  If the bloodwork does not show any allergies then I will refer you to ENT.   Infections  Keep track of infections.  Get bloodwork to look at immune system. We are ordering labs, so please allow 1-2 weeks for the results to come back. With the newly implemented Cures Act, the labs might be visible to you at the same time that they become visible to me. However, I will not address the results until all of the results are back, so please be patient.   Follow up in 3 months or sooner if needed.   Buffered Isotonic Saline Irrigations:  Goal: . When you irrigate with the isotonic saline (salt water) it washes mucous and other debris from your nose that could be contributing to your nasal symptoms.   Recipe: Marland Kitchen Obtain 1 quart jar that is clean . Fill with clean (bottled, boiled or distilled) water . Add 1-2 heaping teaspoons of salt without iodine o If the solution with 2 teaspoons of salt is too strong, adjust the amount down until better tolerated . Add 1 teaspoon of [employer] baking soda (pure bicarbonate) . Mix ingredients together and store at room temperature and discard after 1 week * Alternatively you can buy pre made salt packets for the NeilMed bottle or there          are other over the counter brands available  Instructions: . Warm  cup of the solution in the microwave if desired but be careful not to overheat as this will burn the inside of your nose . Stand over a sink (or do it while you shower) and squirt the solution into one  side of your nose aiming towards the back of your head o Sometimes saying "coca cola" while irrigating can be helpful to prevent fluid from going down your throat  . The solution will travel to the back of your nose and then come out the other side . Perform this again on the other side . Try to do this twice a day . If you are using a nasal spray in addition to the irrigation, irrigate first and then use the topical nasal spray otherwise you will wash the nasal spray out of your nose

## 2020-11-09 LAB — STREP PNEUMONIAE 23 SEROTYPES IGG
Pneumo Ab Type 1*: 0.7 ug/mL — ABNORMAL LOW (ref >1.3)
Pneumo Ab Type 12 (12F)*: 0.2 ug/mL — ABNORMAL LOW (ref >1.3)
Pneumo Ab Type 14*: 3.7 ug/mL (ref >1.3)
Pneumo Ab Type 17 (17F)*: 1.4 ug/mL (ref >1.3)
Pneumo Ab Type 19 (19F)*: 2.9 ug/mL (ref >1.3)
Pneumo Ab Type 2*: 1.1 ug/mL — ABNORMAL LOW (ref >1.3)
Pneumo Ab Type 20*: 1.6 ug/mL (ref >1.3)
Pneumo Ab Type 22 (22F)*: 0.3 ug/mL — ABNORMAL LOW (ref >1.3)
Pneumo Ab Type 23 (23F)*: 0.4 ug/mL — ABNORMAL LOW (ref >1.3)
Pneumo Ab Type 26 (6B)*: 0.4 ug/mL — ABNORMAL LOW (ref >1.3)
Pneumo Ab Type 3*: 2.7 ug/mL (ref >1.3)
Pneumo Ab Type 34 (10A)*: 2 ug/mL (ref >1.3)
Pneumo Ab Type 4*: 0.3 ug/mL — ABNORMAL LOW (ref >1.3)
Pneumo Ab Type 43 (11A)*: 1.2 ug/mL — ABNORMAL LOW (ref >1.3)
Pneumo Ab Type 5*: 1.7 ug/mL (ref >1.3)
Pneumo Ab Type 51 (7F)*: 4.1 ug/mL (ref >1.3)
Pneumo Ab Type 54 (15B)*: 0.4 ug/mL — ABNORMAL LOW (ref >1.3)
Pneumo Ab Type 56 (18C)*: 0.3 ug/mL — ABNORMAL LOW (ref >1.3)
Pneumo Ab Type 57 (19A)*: 2.5 ug/mL (ref >1.3)
Pneumo Ab Type 68 (9V)*: 1.3 ug/mL — ABNORMAL LOW (ref >1.3)
Pneumo Ab Type 70 (33F)*: 5.6 ug/mL (ref >1.3)
Pneumo Ab Type 8*: 0.3 ug/mL — ABNORMAL LOW (ref >1.3)
Pneumo Ab Type 9 (9N)*: 0.7 ug/mL — ABNORMAL LOW (ref >1.3)

## 2020-11-09 LAB — COMPREHENSIVE METABOLIC PANEL
ALT: 14 IU/L (ref 0–32)
AST: 12 IU/L (ref 0–40)
Albumin/Globulin Ratio: 1.8 (ref 1.2–2.2)
Albumin: 4.8 g/dL (ref 3.8–4.9)
Alkaline Phosphatase: 134 IU/L — ABNORMAL HIGH (ref 44–121)
BUN/Creatinine Ratio: 34 — ABNORMAL HIGH (ref 9–23)
BUN: 33 mg/dL — ABNORMAL HIGH (ref 6–24)
Bilirubin Total: <0.2 mg/dL (ref 0.0–1.2)
CO2: 22 mmol/L (ref 20–29)
Calcium: 10.1 mg/dL (ref 8.7–10.2)
Chloride: 98 mmol/L (ref 96–106)
Creatinine, Ser: 0.97 mg/dL (ref 0.57–1.00)
GFR calc Af Amer: 76 mL/min/{1.73_m2} (ref >59)
GFR calc non Af Amer: 65 mL/min/{1.73_m2} (ref >59)
Globulin, Total: 2.6 g/dL (ref 1.5–4.5)
Glucose: 156 mg/dL — ABNORMAL HIGH (ref 65–99)
Potassium: 4.6 mmol/L (ref 3.5–5.2)
Sodium: 137 mmol/L (ref 134–144)
Total Protein: 7.4 g/dL (ref 6.0–8.5)

## 2020-11-09 LAB — ALLERGENS W/TOTAL IGE AREA 2

## 2020-11-09 LAB — CBC WITH DIFFERENTIAL/PLATELET
Basophils Absolute: 0.1 10*3/uL (ref 0.0–0.2)
Basos: 1 %
EOS (ABSOLUTE): 0.6 10*3/uL — ABNORMAL HIGH (ref 0.0–0.4)
Eos: 6 %
Hematocrit: 43.9 % (ref 34.0–46.6)
Hemoglobin: 14.7 g/dL (ref 11.1–15.9)
Immature Grans (Abs): 0 10*3/uL (ref 0.0–0.1)
Immature Granulocytes: 0 %
Lymphocytes Absolute: 3.2 10*3/uL — ABNORMAL HIGH (ref 0.7–3.1)
Lymphs: 35 %
MCH: 32.1 pg (ref 26.6–33.0)
MCHC: 33.5 g/dL (ref 31.5–35.7)
MCV: 96 fL (ref 79–97)
Monocytes Absolute: 0.6 10*3/uL (ref 0.1–0.9)
Monocytes: 7 %
Neutrophils Absolute: 4.9 10*3/uL (ref 1.4–7.0)
Neutrophils: 51 %
Platelets: 353 10*3/uL (ref 150–450)
RBC: 4.58 x10E6/uL (ref 3.77–5.28)
RDW: 12.5 % (ref 11.7–15.4)
WBC: 9.4 10*3/uL (ref 3.4–10.8)

## 2020-11-09 LAB — IGG, IGA, IGM
IgA/Immunoglobulin A, Serum: 301 mg/dL (ref 87–352)
IgG (Immunoglobin G), Serum: 1123 mg/dL (ref 586–1602)
IgM (Immunoglobulin M), Srm: 48 mg/dL (ref 26–217)

## 2020-11-09 LAB — COMPLEMENT, TOTAL: Compl, Total (CH50): >60 U/mL (ref >41)

## 2020-11-09 LAB — DIPHTHERIA / TETANUS ANTIBODY PANEL
Diphtheria Ab: <0.1 IU/mL — ABNORMAL LOW (ref <0.10)
Tetanus Ab, IgG: 0.4 IU/mL (ref <0.10)

## 2020-11-15 ENCOUNTER — Ambulatory Visit: Payer: Medicaid Other | Admitting: Allergy & Immunology

## 2020-11-22 ENCOUNTER — Ambulatory Visit: Payer: Medicaid Other | Attending: Physician Assistant | Admitting: Physical Therapy

## 2021-01-20 ENCOUNTER — Other Ambulatory Visit: Payer: Self-pay | Admitting: Physician Assistant

## 2021-01-20 DIAGNOSIS — Z1231 Encounter for screening mammogram for malignant neoplasm of breast: Secondary | ICD-10-CM

## 2021-01-24 ENCOUNTER — Ambulatory Visit: Payer: Medicaid Other

## 2021-01-31 ENCOUNTER — Ambulatory Visit: Payer: Medicaid Other | Admitting: Allergy

## 2021-01-31 NOTE — Progress Notes (Deleted)
Follow Up Note  RE: Erin Kidd MRN: [id number] DOB: 1966-05-28 Date of Office Visit: 01/31/2021  Referring provider: No ref. provider found Primary care provider: Patient, No Pcp Per (Inactive)  Chief Complaint: No chief complaint on file.  History of Present Illness: I had the pleasure of seeing [name] for a follow up visit at the [facility] on 01/31/2021. She is a 55 y.o. female, who is being followed for nonallergic rhinitis, frequent sinus infections and history of bronchitis. Her previous allergy office visit was on 10/27/2020 with Dr. Selena Batten. Today is a regular follow up visit.  Bloodwork was negative to environmental allergy panel - no allergies causing her rhinitis symptoms. May still use the nasal sprays for symptoms. Recommend ENT referral next.  Regarding the frequent infections - pneumococcal titers and diptheria titer was low. Recommend getting the pneumovax (pneumonia shot) and also the tetanus shot if it's been more than 5 years since the last one. This can be done at PCP's office. Then we will recheck the bloodwork 4 weeks afterwards to make sure immune system responded to these vaccines.   Blood count was unremarkable, normal immunoglobulin levels. Glucose level was elevated at 156. One of the liver enzymes was slightly elevated but the rest were normal - nothing to do just recheck at next visit.   Chronic rhinitis Perennial rhinoconjunctivitis symptoms for 20 years with worsening in the winter with frequent sinusitis.  Skin testing 20 years ago was positive to dust mites per patient report.  No previous allergy injections.  No prior ENT evaluation.  Today's skin testing showed: Negative to indoor/outdoor allergens.   Start nasal saline lavage (i.e., NeilMed) 1-2 times a day.  May use Flonase (fluticasone) nasal spray 1 spray per nostril twice a day as needed for nasal congestion.   May use azelastine nasal spray 1-2 sprays per nostril twice a  day as needed for runny nose/drainage.  Will add on environmental allergy panel to immune evaluation bloodwork. ? If the bloodwork does not show any allergies then will refer to ENT next.   Frequent sinus infections 6-7 sinus infections per year requiring antibiotics. 1 episode of pneumonia in 2014 requiring hospitalization.  Takes medications for diabetes.  Keep track of infections.  Get basic bloodwork to look at immune system.  H/O bronchitis History of frequent bronchitis in the past.  No prior asthma diagnosis.  Today's spirometry showed some restriction most likely due to body habitus.  Return in about 3 months (around 01/25/2021).  Assessment and Plan: [name] is a 55 y.o. female with: No problem-specific Assessment & Plan notes found for this encounter.  No follow-ups on file.  No orders of the defined types were placed in this encounter.  Lab Orders  No laboratory test(s) ordered today    Diagnostics: Spirometry:  Tracings reviewed. Her effort: {Blank single:19197::"Good reproducible efforts.","It was hard to get consistent efforts and there is a question as to whether this reflects a maximal maneuver.","Poor effort, data can not be interpreted."} FVC: ***L FEV1: ***L, ***% predicted FEV1/FVC ratio: ***% Interpretation: {Blank single:19197::"Spirometry consistent with mild obstructive disease","Spirometry consistent with moderate obstructive disease","Spirometry consistent with severe obstructive disease","Spirometry consistent with possible restrictive disease","Spirometry consistent with mixed obstructive and restrictive disease","Spirometry uninterpretable due to technique","Spirometry consistent with normal pattern","No overt abnormalities noted given today's efforts"}.  Please see scanned spirometry results for details.  Skin Testing: {Blank single:19197::"Select foods","Environmental allergy panel","Environmental allergy panel and select foods","Food allergy  panel","None","Deferred due to recent antihistamines use"}.  Positive test to: ***. Negative test to: ***.  Results discussed with patient/family.   Medication List:  Current Outpatient Medications  Medication Sig Dispense Refill  . acetaminophen (TYLENOL) 500 MG tablet Take 1,000 mg by mouth every 6 (six) hours as needed for moderate pain.    Marland Kitchen aspirin 81 MG chewable tablet Chew 1 tablet by mouth daily.    Marland Kitchen atorvastatin (LIPITOR) 40 MG tablet Take 1 tablet by mouth daily.    Marland Kitchen azelastine (ASTELIN) 0.1 % nasal spray Place 1-2 sprays into both nostrils 2 (two) times daily as needed (runny nose/drainage). Use in each nostril as directed 30 mL 5  . calcium carbonate (OSCAL) 1500 (600 Ca) MG TABS tablet Take 1 tablet by mouth daily.    . Cholecalciferol 250 MCG (10000 UT) CAPS Take by mouth.    . dicyclomine (BENTYL) 10 MG capsule Take 10 mg by mouth 4 (four) times daily -  before meals and at bedtime.    . dicyclomine (BENTYL) 20 MG tablet TAKE 1/2 TABLET BY MOUTH FOUR TIMES A DAY    . Empagliflozin-metFORMIN HCl (SYNJARDY) 12.5-500 MG TABS Take 1 tablet by mouth 2 (two) times daily.    . fexofenadine (ALLEGRA) 180 MG tablet Take 1 tablet by mouth daily.    . fluticasone (FLONASE) 50 MCG/ACT nasal spray Place 1 spray into both nostrils 2 (two) times daily as needed (nasal congestion). 16 g 5  . glucose blood (PRECISION QID TEST) test strip 1 Strip by external route every day    . hydrochlorothiazide (MICROZIDE) 12.5 MG capsule Take 1 capsule (12.5 mg total) by mouth daily. 30 capsule 4  . hydrOXYzine (ATARAX/VISTARIL) 25 MG tablet Take 1 tablet (25 mg total) by mouth every 6 (six) hours. 20 tablet 0  . lamoTRIgine (LAMICTAL) 100 MG tablet Take 1 tablet by mouth daily.    Marland Kitchen lamoTRIgine (LAMICTAL) 25 MG tablet Take 25 mg by mouth daily.    Marland Kitchen lisinopril (PRINIVIL,ZESTRIL) 5 MG tablet Take 1 tablet by mouth daily.  11  . lisinopril-hydrochlorothiazide (ZESTORETIC) 10-12.5 MG tablet Take 1 tablet by  mouth daily.    Marland Kitchen LYRICA 150 MG capsule Take 1 capsule by mouth 2 (two) times daily.    . meloxicam (MOBIC) 15 MG tablet Take 15 mg by mouth daily.    . montelukast (SINGULAIR) 10 MG tablet Take 10 mg by mouth at bedtime.    Marland Kitchen oxyCODONE-acetaminophen (PERCOCET) 10-325 MG tablet Take 1 tablet by mouth every 4 (four) hours as needed for pain.    Marland Kitchen permethrin (ELIMITE) 5 % cream Apply to affected area once 60 g 1  . prazosin (MINIPRESS) 1 MG capsule Take 1 mg by mouth at bedtime.    . prazosin (MINIPRESS) 1 MG capsule Take 1 capsule by mouth every evening.    . Skin Protectants, Misc. (EUCERIN) cream Apply topically as needed for wound care. 397 g 0  . tiZANidine (ZANAFLEX) 4 MG tablet Take 4 mg by mouth every 6 (six) hours as needed for muscle spasms.    Marland Kitchen tiZANidine (ZANAFLEX) 4 MG tablet Take 1 tablet by mouth 2 (two) times daily.    Marland Kitchen zolpidem (AMBIEN CR) 12.5 MG CR tablet Take 1 tablet by mouth at bedtime.    Marland Kitchen zolpidem (AMBIEN) 10 MG tablet TAKE ONE TABLET BY MOUTH EVERY NIGHT AT BEDTIME AS NEEDED FOR SLEEP     No current facility-administered medications for this visit.   Allergies: Allergies  Allergen Reactions  . Hydrocodone Hives and  Nausea And Vomiting   I reviewed her past medical history, social history, family history, and environmental history and no significant changes have been reported from her previous visit.  Review of Systems  Constitutional: Negative for appetite change, chills, fever and unexpected weight change.  HENT: Positive for congestion, postnasal drip, rhinorrhea, sinus pressure and sinus pain.   Eyes: Negative for itching.  Respiratory: Negative for cough, chest tightness, shortness of breath and wheezing.   Cardiovascular: Negative for chest pain.  Gastrointestinal: Negative for abdominal pain.  Genitourinary: Negative for difficulty urinating.  Skin: Negative for rash.  Allergic/Immunologic: Negative for environmental allergies.  Neurological: Positive  for headaches.   Objective: LMP 09/15/2014  There is no height or weight on file to calculate BMI. Physical Exam Vitals and nursing note reviewed.  Constitutional:      Appearance: Normal appearance. She is well-developed. She is obese.  HENT:     Head: Normocephalic and atraumatic.     Right Ear: Tympanic membrane and external ear normal.     Left Ear: Tympanic membrane and external ear normal.     Nose: Nose normal.     Mouth/Throat:     Mouth: Mucous membranes are moist.     Pharynx: Oropharynx is clear.     Comments: edentulous Eyes:     Conjunctiva/sclera: Conjunctivae normal.  Cardiovascular:     Rate and Rhythm: Normal rate and regular rhythm.     Heart sounds: Normal heart sounds. No murmur heard. No friction rub. No gallop.   Pulmonary:     Effort: Pulmonary effort is normal.     Breath sounds: Normal breath sounds. No wheezing, rhonchi or rales.  Abdominal:     Palpations: Abdomen is soft.  Musculoskeletal:     Cervical back: Neck supple.  Skin:    General: Skin is warm.     Findings: No rash.  Neurological:     Mental Status: She is alert and oriented to person, place, and time.  Psychiatric:        Behavior: Behavior normal.    Previous notes and tests were reviewed. The plan was reviewed with the patient/family, and all questions/concerned were addressed.  It was my pleasure to see Leea today and participate in her care. Please feel free to contact me with any questions or concerns.  Sincerely,  Wyline Mood, DO Allergy & Immunology  [facility] office: [phone] [facility] office: [phone]

## 2022-04-25 ENCOUNTER — Encounter (HOSPITAL_COMMUNITY): Payer: Self-pay

## 2022-04-25 ENCOUNTER — Emergency Department (HOSPITAL_COMMUNITY)
Admission: EM | Admit: 2022-04-25 | Discharge: 2022-04-25 | Disposition: A | Discharge status: Home / Self Care | Payer: Medicaid Other | Attending: Emergency Medicine | Admitting: Emergency Medicine

## 2022-04-25 ENCOUNTER — Emergency Department (HOSPITAL_COMMUNITY): Payer: Medicaid Other

## 2022-04-25 DIAGNOSIS — M7989 Other specified soft tissue disorders: Secondary | ICD-10-CM | POA: Diagnosis not present

## 2022-04-25 DIAGNOSIS — X501XXA Overexertion from prolonged static or awkward postures, initial encounter: Secondary | ICD-10-CM | POA: Diagnosis not present

## 2022-04-25 DIAGNOSIS — Y9301 Activity, walking, marching and hiking: Secondary | ICD-10-CM | POA: Diagnosis not present

## 2022-04-25 DIAGNOSIS — S92512A Displaced fracture of proximal phalanx of left lesser toe(s), initial encounter for closed fracture: Secondary | ICD-10-CM | POA: Insufficient documentation

## 2022-04-25 DIAGNOSIS — Z7982 Long term (current) use of aspirin: Secondary | ICD-10-CM | POA: Insufficient documentation

## 2022-04-25 DIAGNOSIS — S92302A Fracture of unspecified metatarsal bone(s), left foot, initial encounter for closed fracture: Secondary | ICD-10-CM

## 2022-04-25 DIAGNOSIS — S99922A Unspecified injury of left foot, initial encounter: Secondary | ICD-10-CM | POA: Diagnosis present

## 2022-04-25 NOTE — ED Provider Triage Note (Signed)
Emergency Medicine Provider Triage Evaluation Note  Erin Kidd , a 56 y.o. female  was evaluated in triage.  Pt complains of left foot pain.  Patient states that she was walking up steps and felt a pop and crack in the bottom of her left foot.  She denies falling or hitting her head.  This occurred on Saturday.  Review of Systems  Positive: Left foot pain Negative: Loss of consciousness  Physical Exam  BP 118/61 (BP Location: Left Arm)   Pulse 69   Temp 98.6 F (37 C) (Oral)   Resp 18   LMP 09/15/2014   SpO2 94%  Gen:   Awake, no distress   Resp:  Normal effort  MSK:   Moves extremities without difficulty  Other:    Medical Decision Making  Medically screening exam initiated at 11:43 AM.  Appropriate orders placed.  Erin Kidd was informed that the remainder of the evaluation will be completed by another provider, this initial triage assessment does not replace that evaluation, and the importance of remaining in the ED until their evaluation is complete.     Darrick Grinder, PA-C 04/25/22 1144

## 2022-04-25 NOTE — ED Provider Notes (Signed)
[facility]-EMERGENCY DEPT Provider Note   CSN: [id number] Arrival date & time: 04/25/22  1114     History  Chief Complaint  Patient presents with   Foot Injury    Erin Kidd is a 56 y.o. female.  Patient with no pertinent past medical history presents today with complaints of left foot injury. She states that on Saturday she was walking up some steps and felt a popping sound in her foot with pain. She states that she has been able to walk since then but the pain has persisted to be significant. She denies falling or hitting her head when this happened.  The history is provided by the patient. No language interpreter was used.  Foot Injury      Home Medications Prior to Admission medications   Medication Sig Start Date End Date Taking? Authorizing Provider  acetaminophen (TYLENOL) 500 MG tablet Take 1,000 mg by mouth every 6 (six) hours as needed for moderate pain.    [provider]  aspirin 81 MG chewable tablet Chew 1 tablet by mouth daily. 04/29/20   [provider]  atorvastatin (LIPITOR) 40 MG tablet Take 1 tablet by mouth daily. 05/25/20   [provider]  azelastine (ASTELIN) 0.1 % nasal spray Place 1-2 sprays into both nostrils 2 (two) times daily as needed (runny nose/drainage). Use in each nostril as directed 10/27/20   Ellamae Sia, DO  calcium carbonate (OSCAL) 1500 (600 Ca) MG TABS tablet Take 1 tablet by mouth daily. 05/23/20   [provider]  Cholecalciferol 250 MCG (10000 UT) CAPS Take by mouth. 05/23/20   [provider]  dicyclomine (BENTYL) 10 MG capsule Take 10 mg by mouth 4 (four) times daily -  before meals and at bedtime.    [provider]  dicyclomine (BENTYL) 20 MG tablet TAKE 1/2 TABLET BY MOUTH FOUR TIMES A DAY 05/06/20   [provider]  Empagliflozin-metFORMIN HCl (SYNJARDY) 12.5-500 MG TABS Take 1 tablet by mouth 2 (two) times daily. 05/25/20   [provider]   fexofenadine (ALLEGRA) 180 MG tablet Take 1 tablet by mouth daily. 06/05/18   [provider]  fluticasone (FLONASE) 50 MCG/ACT nasal spray Place 1 spray into both nostrils 2 (two) times daily as needed (nasal congestion). 10/27/20   Ellamae Sia, DO  glucose blood (PRECISION QID TEST) test strip 1 Strip by external route every day 03/28/15   [provider]  hydrochlorothiazide (MICROZIDE) 12.5 MG capsule Take 1 capsule (12.5 mg total) by mouth daily. 10/01/14   Ambrose Finland, NP  hydrOXYzine (ATARAX/VISTARIL) 25 MG tablet Take 1 tablet (25 mg total) by mouth every 6 (six) hours. 11/09/18   Janne Napoleon, NP  lamoTRIgine (LAMICTAL) 100 MG tablet Take 1 tablet by mouth daily. 05/10/20   [provider]  lamoTRIgine (LAMICTAL) 25 MG tablet Take 25 mg by mouth daily. 08/25/17   [provider]  lisinopril (PRINIVIL,ZESTRIL) 5 MG tablet Take 1 tablet by mouth daily. 08/19/17   [provider]  lisinopril-hydrochlorothiazide (ZESTORETIC) 10-12.5 MG tablet Take 1 tablet by mouth daily. 04/28/20   [provider]  LYRICA 150 MG capsule Take 1 capsule by mouth 2 (two) times daily. 08/29/17   [provider]  meloxicam (MOBIC) 15 MG tablet Take 15 mg by mouth daily.    [provider]  montelukast (SINGULAIR) 10 MG tablet Take 10 mg by mouth at bedtime.    [provider]  oxyCODONE-acetaminophen (PERCOCET)  10-325 MG tablet Take 1 tablet by mouth every 4 (four) hours as needed for pain.    [provider]  permethrin (ELIMITE) 5 % cream Apply to affected area once 10/23/18   Elson Areas, PA-C  prazosin (MINIPRESS) 1 MG capsule Take 1 mg by mouth at bedtime. 04/25/17   [provider]  prazosin (MINIPRESS) 1 MG capsule Take 1 capsule by mouth every evening. 08/23/20   [provider]  Skin Protectants, Misc. (EUCERIN) cream Apply topically as needed for wound care. 11/09/18   Janne Napoleon, NP  tiZANidine  (ZANAFLEX) 4 MG tablet Take 4 mg by mouth every 6 (six) hours as needed for muscle spasms.    [provider]  tiZANidine (ZANAFLEX) 4 MG tablet Take 1 tablet by mouth 2 (two) times daily. 04/28/20   [provider]  zolpidem (AMBIEN CR) 12.5 MG CR tablet Take 1 tablet by mouth at bedtime. 08/25/17   [provider]  zolpidem (AMBIEN) 10 MG tablet TAKE ONE TABLET BY MOUTH EVERY NIGHT AT BEDTIME AS NEEDED FOR SLEEP 04/28/20   [provider]      Allergies    Hydrocodone    Review of Systems   Review of Systems  Musculoskeletal:  Positive for arthralgias.  All other systems reviewed and are negative.   Physical Exam Updated Vital Signs BP 118/61 (BP Location: Left Arm)   Pulse 69   Temp 98.6 F (37 C) (Oral)   Resp 18   LMP 09/15/2014   SpO2 94%  Physical Exam Vitals and nursing note reviewed.  Constitutional:      General: She is not in acute distress.    Appearance: Normal appearance. She is normal weight. She is not ill-appearing, toxic-appearing or diaphoretic.  HENT:     Head: Normocephalic and atraumatic.  Cardiovascular:     Rate and Rhythm: Normal rate.  Pulmonary:     Effort: Pulmonary effort is normal. No respiratory distress.  Musculoskeletal:        General: Normal range of motion.     Cervical back: Normal range of motion.     Comments: Tenderness to palpation of the lateral portion of the left foot along the tarsometatarsal joint. Capillary refill less than 2 seconds, distal sensation intact. No obvious crepitus or deformity. Able to move toes with minimal pain.  Skin:    General: Skin is warm and dry.  Neurological:     General: No focal deficit present.     Mental Status: She is alert.  Psychiatric:        Mood and Affect: Mood normal.        Behavior: Behavior normal.     ED Results / Procedures / Treatments   Labs (all labs ordered are listed, but only abnormal results are displayed) Labs Reviewed - No data to  display  EKG None  Radiology DG Foot Complete Left  Result Date: 04/25/2022 CLINICAL DATA:  Sudden plantar and lateral pain in the left foot. EXAM: LEFT FOOT - COMPLETE 3+ VIEW COMPARISON:  None Available. FINDINGS: Question avulsion fractures of the bases of the fourth and fifth proximal phalanges, only seen on the frontal view. No other fractures are seen. 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 Diffuse soft tissue swelling. IMPRESSION: 1. Question avulsion fractures of the bases of the fourth and fifth proximal phalanges, only seen on the frontal view. Please correlate to the point of tenderness. 2. Diffuse soft tissue swelling. 3. 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 Electronically  Signed   By: Fidela Salisbury M.D.   On: 04/25/2022 12:06    Procedures Procedures    Medications Ordered in ED Medications - No data to display  ED Course/ Medical Decision Making/ A&P                           Medical Decision Making  Patient presents today with complaints of left foot injury that occurred last Saturday. X-Ray imaging obtained which revealed  1. Question avulsion fractures of the bases of the fourth and fifth proximal phalanges, only seen on the frontal view. Please correlate to the point of tenderness. 2. Diffuse soft tissue swelling.  I have personally reviewed and interpreted this imaging and agree with radiology interpretation.    Given patients location of point tenderness which correlates with the questionable avulsion fractures seen on imaging, will give walking boot and crutches for same. Patient also educated to limit ambulation as much as possible. Will also give referral to orthopedics for further evaluation and management. Conservative therapy with RICE and tylenol/ibuprofen recommended and discussed. Patient will be dc home & is agreeable with above plan. Educated on red flag symptoms that would prompt immediate return, discharged in stable condition.  Findings and  plan of care discussed with supervising physician Dr. Vanita Panda who is in agreement.    Final Clinical Impression(s) / ED Diagnoses Final diagnoses:  Closed avulsion fracture of metatarsal bone of left foot, initial encounter    Rx / DC Orders ED Discharge Orders     None     An After Visit Summary was printed and given to the patient.     Nestor Lewandowsky 04/25/22 1308    Lacretia Leigh, MD 04/26/22 1040

## 2022-04-25 NOTE — ED Triage Notes (Signed)
Pt states that at on Saturday she was walking using stairs and felt a "pop" in the bottom of her L foot. Pt denies fall.

## 2022-04-25 NOTE — Discharge Instructions (Signed)
As we discussed, x-ray imaging of your foot did show some concern for a fracture at the base of your fourth and fifth toes.  For this I have given you a walking boot and crutches that you will need to wear regularly and limit your walking is much as possible.  I also recommend that you rest, ice, compress, and elevate your foot is much as possible and take Tylenol/ibuprofen as needed for pain.  I have also given you a referral to orthopedics with a number to call to schedule an appointment in the next week for further evaluation and management of your injury.  Return if development of any new or worsening symptoms.

## 2022-04-25 NOTE — Progress Notes (Signed)
Orthopedic Tech Progress Note Patient Details:  Erin Kidd 1966-08-23 [id number]  Ortho Devices Type of Ortho Device: CAM walker Ortho Device/Splint Location: LLE Ortho Device/Splint Interventions: Application   Post Interventions Patient Tolerated: Well  Genelle Bal [name] 04/25/2022, 1:23 PM

## 2022-05-25 ENCOUNTER — Other Ambulatory Visit: Payer: Self-pay | Admitting: Allergy

## 2022-09-19 ENCOUNTER — Encounter: Payer: Self-pay | Admitting: Nurse Practitioner

## 2022-09-19 ENCOUNTER — Other Ambulatory Visit: Payer: Self-pay | Admitting: Nurse Practitioner

## 2022-09-19 DIAGNOSIS — Z1231 Encounter for screening mammogram for malignant neoplasm of breast: Secondary | ICD-10-CM

## 2022-10-25 ENCOUNTER — Encounter: Payer: Self-pay | Admitting: Neurology

## 2023-03-05 NOTE — Progress Notes (Deleted)
NEUROLOGY CONSULTATION NOTE  Kalyce Balek MRN: [id number] DOB: ***  Referring provider: *** Primary care provider: ***  Reason for consult:  ***  Assessment/Plan:   ***   Subjective:  Erin Kidd is a 57 year old female with DM II, HLD, fibromyalgia, Bipolar 1 disorder, and PTSD who presents for migraines.  History supplemented by referring provider's note.  She has history of recurrent headaches since ***.  As they have been associated with nasal congestion and discharge, they have presumably been treated as sinus infections.  She saw ENT in January.  CT of sinuses were clear.  Also concern for TMJ dysfunction.    Remote CT head on 06/16/2004 personally reviewed was negative.  Past NSAIDS/analgesics:  *** Past abortive triptans:  none Past abortive ergotamine:  none Past muscle relaxants:  *** Past anti-emetic:  *** Past antihypertensive medications:  HCTZ Past antidepressant medications:  *** Past anticonvulsant medications:  pregablin Past anti-CGRP:  none Past vitamins/Herbal/Supplements:  none Past antihistamines/decongestants:  Astelin, Allegra Other past therapies:  none  Current NSAIDS/analgesics:  meloxicam 15mg  Current triptans:  none Current ergotamine:  none Current anti-emetic:  none Current muscle relaxants:  tizanidine 4mg  PRN Current Antihypertensive medications:  lisinopril, pazosin Current Antidepressant medications:  none Current Anticonvulsant medications:  lamotrigine 100mg  Current anti-CGRP:  none Current Vitamins/Herbal/Supplements:  none Current Antihistamines/Decongestants:  Flonase Other therapy:  *** Other medications:  Ambien   Caffeine:  *** Alcohol:  *** Smoker:  *** Diet:  *** Exercise:  *** Depression:  ***; Anxiety:  *** Other pain:  *** Sleep hygiene:  *** Family history of headache:  ***      PAST MEDICAL HISTORY: Past Medical History:  Diagnosis Date   Anxiety    Arthritis    Bipolar 1 disorder (HCC)    Bursitis     Chronic pain    Diabetes mellitus without complication (HCC)    Eczema    Fibromyalgia    Herniated disc    PTSD (post-traumatic stress disorder)     PAST SURGICAL HISTORY: Past Surgical History:  Procedure Laterality Date   BACK SURGERY     CERVICAL SPINE SURGERY     CESAREAN SECTION     HERNIA REPAIR     TONSILLECTOMY      MEDICATIONS: Current Outpatient Medications on File Prior to Visit  Medication Sig Dispense Refill   acetaminophen (TYLENOL) 500 MG tablet Take 1,000 mg by mouth every 6 (six) hours as needed for moderate pain.     aspirin 81 MG chewable tablet Chew 1 tablet by mouth daily.     atorvastatin (LIPITOR) 40 MG tablet Take 1 tablet by mouth daily.     azelastine (ASTELIN) 0.1 % nasal spray Place 1-2 sprays into both nostrils 2 (two) times daily as needed (runny nose/drainage). Use in each nostril as directed 30 mL 5   calcium carbonate (OSCAL) 1500 (600 Ca) MG TABS tablet Take 1 tablet by mouth daily.     Cholecalciferol 250 MCG (10000 UT) CAPS Take by mouth.     dicyclomine (BENTYL) 10 MG capsule Take 10 mg by mouth 4 (four) times daily -  before meals and at bedtime.     dicyclomine (BENTYL) 20 MG tablet TAKE 1/2 TABLET BY MOUTH FOUR TIMES A DAY     Empagliflozin-metFORMIN HCl (SYNJARDY) 12.5-500 MG TABS Take 1 tablet by mouth 2 (two) times daily.     fexofenadine (ALLEGRA) 180 MG tablet Take 1 tablet by mouth daily.     fluticasone (  FLONASE) 50 MCG/ACT nasal spray Place 1 spray into both nostrils 2 (two) times daily as needed (nasal congestion). 16 g 5   glucose blood (PRECISION QID TEST) test strip 1 Strip by external route every day     hydrochlorothiazide (MICROZIDE) 12.5 MG capsule Take 1 capsule (12.5 mg total) by mouth daily. 30 capsule 4   hydrOXYzine (ATARAX/VISTARIL) 25 MG tablet Take 1 tablet (25 mg total) by mouth every 6 (six) hours. 20 tablet 0   lamoTRIgine (LAMICTAL) 100 MG tablet Take 1 tablet by mouth daily.     lamoTRIgine (LAMICTAL) 25 MG  tablet Take 25 mg by mouth daily.     lisinopril (PRINIVIL,ZESTRIL) 5 MG tablet Take 1 tablet by mouth daily.  11   lisinopril-hydrochlorothiazide (ZESTORETIC) 10-12.5 MG tablet Take 1 tablet by mouth daily.     LYRICA 150 MG capsule Take 1 capsule by mouth 2 (two) times daily.     meloxicam (MOBIC) 15 MG tablet Take 15 mg by mouth daily.     montelukast (SINGULAIR) 10 MG tablet Take 10 mg by mouth at bedtime.     oxyCODONE-acetaminophen (PERCOCET) 10-325 MG tablet Take 1 tablet by mouth every 4 (four) hours as needed for pain.     permethrin (ELIMITE) 5 % cream Apply to affected area once 60 g 1   prazosin (MINIPRESS) 1 MG capsule Take 1 mg by mouth at bedtime.     prazosin (MINIPRESS) 1 MG capsule Take 1 capsule by mouth every evening.     Skin Protectants, Misc. (EUCERIN) cream Apply topically as needed for wound care. 397 g 0   tiZANidine (ZANAFLEX) 4 MG tablet Take 4 mg by mouth every 6 (six) hours as needed for muscle spasms.     tiZANidine (ZANAFLEX) 4 MG tablet Take 1 tablet by mouth 2 (two) times daily.     zolpidem (AMBIEN CR) 12.5 MG CR tablet Take 1 tablet by mouth at bedtime.     zolpidem (AMBIEN) 10 MG tablet TAKE ONE TABLET BY MOUTH EVERY NIGHT AT BEDTIME AS NEEDED FOR SLEEP     No current facility-administered medications on file prior to visit.    ALLERGIES: Allergies  Allergen Reactions   Hydrocodone Hives and Nausea And Vomiting    FAMILY HISTORY: Family History  Problem Relation Age of Onset   Asthma Brother     Objective:  *** General: No acute distress.  Patient appears well-groomed.   Head:  Normocephalic/atraumatic Eyes:  fundi examined but not visualized Neck: supple, no paraspinal tenderness, full range of motion Back: No paraspinal tenderness Heart: regular rate and rhythm Lungs: Clear to auscultation bilaterally. Vascular: No carotid bruits. Neurological Exam: Mental status: alert and oriented to person, place, and time, speech fluent and not  dysarthric, language intact. Cranial nerves: CN I: not tested CN II: pupils equal, round and reactive to light, visual fields intact CN III, IV, VI:  full range of motion, no nystagmus, no ptosis CN V: facial sensation intact. CN VII: upper and lower face symmetric CN VIII: hearing intact CN IX, X: gag intact, uvula midline CN XI: sternocleidomastoid and trapezius muscles intact CN XII: tongue midline Bulk & Tone: normal, no fasciculations. Motor:  muscle strength 5/5 throughout Sensation:  Pinprick, temperature and vibratory sensation intact. Deep Tendon Reflexes:  2+ throughout,  toes downgoing.   Finger to nose testing:  Without dysmetria.   Heel to shin:  Without dysmetria.   Gait:  Normal station and stride.  Romberg negative.  Thank you for allowing me to take part in the care of this patient.  Shon Millet, DO  CC: ***

## 2023-03-07 ENCOUNTER — Ambulatory Visit: Payer: Medicaid Other | Admitting: Neurology

## 2023-03-21 DIAGNOSIS — E1165 Type 2 diabetes mellitus with hyperglycemia: Secondary | ICD-10-CM | POA: Diagnosis not present

## 2023-04-23 ENCOUNTER — Ambulatory Visit: Payer: Medicaid Other | Admitting: Orthopaedic Surgery

## 2023-04-29 NOTE — Progress Notes (Deleted)
NEUROLOGY CONSULTATION NOTE  Erin Kidd MRN: [id number] DOB: 1966/01/01  Referring provider: Serena Colonel, MD Primary care provider: Prudy Feeler, PA-C  Reason for consult:  migraines  Assessment/Plan:   Migraine without aura, ***  Migraine prevention:  *** Migraine rescue:  *** Limit use of pain relievers to no more than 2 days out of week to prevent risk of rebound or medication-overuse headache. Keep headache diary Follow up ***    Subjective:  Erin Kidd is a 57 year old female with DM 2, PTSD, Bipolar 1 disorder and fibromyalgia and history of cervical spine surgery who presents for migraines.  History supplemented by referring provider's note.   ***.  She previously was diagnosed with recurrent sinusitis and saw ENT.  CT sinuses negative.    Past NSAIDS/analgesics:  *** Past abortive triptans:  *** Past abortive ergotamine:  *** Past muscle relaxants:  *** Past anti-emetic:  *** Past antihypertensive medications:  *** Past antidepressant medications:  *** Past anticonvulsant medications:  *** Past anti-CGRP:  *** Past vitamins/Herbal/Supplements:  *** Past antihistamines/decongestants:  *** Other past therapies:  ***  Current NSAIDS/analgesics:  *** Current triptans:  *** Current ergotamine:  *** Current anti-emetic:  *** Current muscle relaxants:  *** Current Antihypertensive medications:  *** Current Antidepressant medications:  *** Current Anticonvulsant medications:  *** Current anti-CGRP:  *** Current Vitamins/Herbal/Supplements:  *** Current Antihistamines/Decongestants:  *** Other therapy:  *** Birth control:  *** Other medications:  ***   Caffeine:  *** Alcohol:  *** Smoker:  *** Diet:  *** Exercise:  *** Depression:  ***; Anxiety:  *** Other pain:  *** Sleep hygiene:  *** Family history of headache:  ***   PAST MEDICAL HISTORY: Past Medical History:  Diagnosis Date   Anxiety    Arthritis    Bipolar 1 disorder (HCC)    Bursitis     Chronic pain    Diabetes mellitus without complication (HCC)    Eczema    Fibromyalgia    Herniated disc    PTSD (post-traumatic stress disorder)     PAST SURGICAL HISTORY: Past Surgical History:  Procedure Laterality Date   BACK SURGERY     CERVICAL SPINE SURGERY     CESAREAN SECTION     HERNIA REPAIR     TONSILLECTOMY      MEDICATIONS: Current Outpatient Medications on File Prior to Visit  Medication Sig Dispense Refill   acetaminophen (TYLENOL) 500 MG tablet Take 1,000 mg by mouth every 6 (six) hours as needed for moderate pain.     aspirin 81 MG chewable tablet Chew 1 tablet by mouth daily.     atorvastatin (LIPITOR) 40 MG tablet Take 1 tablet by mouth daily.     azelastine (ASTELIN) 0.1 % nasal spray Place 1-2 sprays into both nostrils 2 (two) times daily as needed (runny nose/drainage). Use in each nostril as directed 30 mL 5   calcium carbonate (OSCAL) 1500 (600 Ca) MG TABS tablet Take 1 tablet by mouth daily.     Cholecalciferol 250 MCG (10000 UT) CAPS Take by mouth.     dicyclomine (BENTYL) 10 MG capsule Take 10 mg by mouth 4 (four) times daily -  before meals and at bedtime.     dicyclomine (BENTYL) 20 MG tablet TAKE 1/2 TABLET BY MOUTH FOUR TIMES A DAY     Empagliflozin-metFORMIN HCl (SYNJARDY) 12.5-500 MG TABS Take 1 tablet by mouth 2 (two) times daily.     fexofenadine (ALLEGRA) 180 MG tablet Take 1 tablet by  mouth daily.     fluticasone (FLONASE) 50 MCG/ACT nasal spray Place 1 spray into both nostrils 2 (two) times daily as needed (nasal congestion). 16 g 5   glucose blood (PRECISION QID TEST) test strip 1 Strip by external route every day     hydrochlorothiazide (MICROZIDE) 12.5 MG capsule Take 1 capsule (12.5 mg total) by mouth daily. 30 capsule 4   hydrOXYzine (ATARAX/VISTARIL) 25 MG tablet Take 1 tablet (25 mg total) by mouth every 6 (six) hours. 20 tablet 0   lamoTRIgine (LAMICTAL) 100 MG tablet Take 1 tablet by mouth daily.     lamoTRIgine (LAMICTAL) 25 MG  tablet Take 25 mg by mouth daily.     lisinopril (PRINIVIL,ZESTRIL) 5 MG tablet Take 1 tablet by mouth daily.  11   lisinopril-hydrochlorothiazide (ZESTORETIC) 10-12.5 MG tablet Take 1 tablet by mouth daily.     LYRICA 150 MG capsule Take 1 capsule by mouth 2 (two) times daily.     meloxicam (MOBIC) 15 MG tablet Take 15 mg by mouth daily.     montelukast (SINGULAIR) 10 MG tablet Take 10 mg by mouth at bedtime.     oxyCODONE-acetaminophen (PERCOCET) 10-325 MG tablet Take 1 tablet by mouth every 4 (four) hours as needed for pain.     permethrin (ELIMITE) 5 % cream Apply to affected area once 60 g 1   prazosin (MINIPRESS) 1 MG capsule Take 1 mg by mouth at bedtime.     prazosin (MINIPRESS) 1 MG capsule Take 1 capsule by mouth every evening.     Skin Protectants, Misc. (EUCERIN) cream Apply topically as needed for wound care. 397 g 0   tiZANidine (ZANAFLEX) 4 MG tablet Take 4 mg by mouth every 6 (six) hours as needed for muscle spasms.     tiZANidine (ZANAFLEX) 4 MG tablet Take 1 tablet by mouth 2 (two) times daily.     zolpidem (AMBIEN CR) 12.5 MG CR tablet Take 1 tablet by mouth at bedtime.     zolpidem (AMBIEN) 10 MG tablet TAKE ONE TABLET BY MOUTH EVERY NIGHT AT BEDTIME AS NEEDED FOR SLEEP     No current facility-administered medications on file prior to visit.    ALLERGIES: Allergies  Allergen Reactions   Hydrocodone Hives and Nausea And Vomiting    FAMILY HISTORY: Family History  Problem Relation Age of Onset   Asthma Brother     Objective:  *** General: No acute distress.  Patient appears well-groomed.   Head:  Normocephalic/atraumatic Eyes:  fundi examined but not visualized Neck: supple, no paraspinal tenderness, full range of motion Back: No paraspinal tenderness Heart: regular rate and rhythm Lungs: Clear to auscultation bilaterally. Vascular: No carotid bruits. Neurological Exam: Mental status: alert and oriented to person, place, and time, speech fluent and not  dysarthric, language intact. Cranial nerves: CN I: not tested CN II: pupils equal, round and reactive to light, visual fields intact CN III, IV, VI:  full range of motion, no nystagmus, no ptosis CN V: facial sensation intact. CN VII: upper and lower face symmetric CN VIII: hearing intact CN IX, X: gag intact, uvula midline CN XI: sternocleidomastoid and trapezius muscles intact CN XII: tongue midline Bulk & Tone: normal, no fasciculations. Motor:  muscle strength 5/5 throughout Sensation:  Pinprick, temperature and vibratory sensation intact. Deep Tendon Reflexes:  2+ throughout,  toes downgoing.   Finger to nose testing:  Without dysmetria.   Heel to shin:  Without dysmetria.   Gait:  Normal station and  stride.  Romberg negative.    Thank you for allowing me to take part in the care of this patient.  Shon Millet, DO  CC: ***

## 2023-04-30 ENCOUNTER — Ambulatory Visit: Payer: Medicaid Other | Admitting: Neurology

## 2023-06-03 DIAGNOSIS — F1120 Opioid dependence, uncomplicated: Secondary | ICD-10-CM | POA: Diagnosis not present

## 2023-06-04 NOTE — Progress Notes (Deleted)
NEUROLOGY CONSULTATION NOTE  Erin Kidd MRN: [id number] DOB: [birth year]  Referring provider: Serena Colonel, MD Primary care provider: Prudy Feeler, PA-C  Reason for consult:  migraines  Assessment/Plan:   ***   Subjective:  Erin Kidd is a 57 year old ***-handed female with PTSD, Bipolar 1 disorder, DM 2, fibromyalgia and arthritis who presents for migraine.  History supplemented by referring provider's note.  History of recurrent headaches since ***, thought to be related to sinus infections.  ***.  She saw ENT in January.  CT sinuses were negative (only mild mucosal edema in the bilateral maxillary sinus), indicating that her headaches are not related to her sinuses.    Remote CT head from 06/16/2004 personally reviewed was negative.   Past NSAIDS/analgesics:  *** Past abortive triptans:  *** Past abortive ergotamine:  *** Past muscle relaxants:  *** Past anti-emetic:  *** Past antihypertensive medications:  *** Past antidepressant medications:  *** Past anticonvulsant medications:  *** Past anti-CGRP:  *** Past vitamins/Herbal/Supplements:  *** Past antihistamines/decongestants:  *** Other past therapies:  ***  Current NSAIDS/analgesics:  meloxicam Current triptans:  *** Current ergotamine:  *** Current anti-emetic:  *** Current muscle relaxants:  tizanidine 4mg  PRN Current Antihypertensive medications:  lisinopril, hydrochlorothiazide, prazosin Current Antidepressant medications:  *** Current Anticonvulsant medications:  pregablin 150mg  BID, lamotrigine 100mg  daily Current anti-CGRP:  *** Current Vitamins/Herbal/Supplements:  *** Current Antihistamines/Decongestants:  Flonase Other medications:  Ambien   Caffeine:  *** Alcohol:  *** Smoker:  *** Diet:  *** Exercise:  *** Depression:  ***; Anxiety:  *** Other pain:  *** Sleep hygiene:  *** Family history of headache:  ***      PAST MEDICAL HISTORY: Past Medical History:  Diagnosis Date   Anxiety     Arthritis    Bipolar 1 disorder (HCC)    Bursitis    Chronic pain    Diabetes mellitus without complication (HCC)    Eczema    Fibromyalgia    Herniated disc    PTSD (post-traumatic stress disorder)     PAST SURGICAL HISTORY: Past Surgical History:  Procedure Laterality Date   BACK SURGERY     CERVICAL SPINE SURGERY     CESAREAN SECTION     HERNIA REPAIR     TONSILLECTOMY      MEDICATIONS: Current Outpatient Medications on File Prior to Visit  Medication Sig Dispense Refill   acetaminophen (TYLENOL) 500 MG tablet Take 1,000 mg by mouth every 6 (six) hours as needed for moderate pain.     aspirin 81 MG chewable tablet Chew 1 tablet by mouth daily.     atorvastatin (LIPITOR) 40 MG tablet Take 1 tablet by mouth daily.     azelastine (ASTELIN) 0.1 % nasal spray Place 1-2 sprays into both nostrils 2 (two) times daily as needed (runny nose/drainage). Use in each nostril as directed 30 mL 5   calcium carbonate (OSCAL) 1500 (600 Ca) MG TABS tablet Take 1 tablet by mouth daily.     Cholecalciferol 250 MCG (10000 UT) CAPS Take by mouth.     dicyclomine (BENTYL) 10 MG capsule Take 10 mg by mouth 4 (four) times daily -  before meals and at bedtime.     dicyclomine (BENTYL) 20 MG tablet TAKE 1/2 TABLET BY MOUTH FOUR TIMES A DAY     Empagliflozin-metFORMIN HCl (SYNJARDY) 12.5-500 MG TABS Take 1 tablet by mouth 2 (two) times daily.     fexofenadine (ALLEGRA) 180 MG tablet Take 1 tablet by mouth daily.  fluticasone (FLONASE) 50 MCG/ACT nasal spray Place 1 spray into both nostrils 2 (two) times daily as needed (nasal congestion). 16 g 5   glucose blood (PRECISION QID TEST) test strip 1 Strip by external route every day     hydrochlorothiazide (MICROZIDE) 12.5 MG capsule Take 1 capsule (12.5 mg total) by mouth daily. 30 capsule 4   hydrOXYzine (ATARAX/VISTARIL) 25 MG tablet Take 1 tablet (25 mg total) by mouth every 6 (six) hours. 20 tablet 0   lamoTRIgine (LAMICTAL) 100 MG tablet Take 1  tablet by mouth daily.     lamoTRIgine (LAMICTAL) 25 MG tablet Take 25 mg by mouth daily.     lisinopril (PRINIVIL,ZESTRIL) 5 MG tablet Take 1 tablet by mouth daily.  11   lisinopril-hydrochlorothiazide (ZESTORETIC) 10-12.5 MG tablet Take 1 tablet by mouth daily.     LYRICA 150 MG capsule Take 1 capsule by mouth 2 (two) times daily.     meloxicam (MOBIC) 15 MG tablet Take 15 mg by mouth daily.     montelukast (SINGULAIR) 10 MG tablet Take 10 mg by mouth at bedtime.     oxyCODONE-acetaminophen (PERCOCET) 10-325 MG tablet Take 1 tablet by mouth every 4 (four) hours as needed for pain.     permethrin (ELIMITE) 5 % cream Apply to affected area once 60 g 1   prazosin (MINIPRESS) 1 MG capsule Take 1 mg by mouth at bedtime.     prazosin (MINIPRESS) 1 MG capsule Take 1 capsule by mouth every evening.     Skin Protectants, Misc. (EUCERIN) cream Apply topically as needed for wound care. 397 g 0   tiZANidine (ZANAFLEX) 4 MG tablet Take 4 mg by mouth every 6 (six) hours as needed for muscle spasms.     tiZANidine (ZANAFLEX) 4 MG tablet Take 1 tablet by mouth 2 (two) times daily.     zolpidem (AMBIEN CR) 12.5 MG CR tablet Take 1 tablet by mouth at bedtime.     zolpidem (AMBIEN) 10 MG tablet TAKE ONE TABLET BY MOUTH EVERY NIGHT AT BEDTIME AS NEEDED FOR SLEEP     No current facility-administered medications on file prior to visit.    ALLERGIES: Allergies  Allergen Reactions   Hydrocodone Hives and Nausea And Vomiting    FAMILY HISTORY: Family History  Problem Relation Age of Onset   Asthma Brother     Objective:  *** General: No acute distress.  Patient appears well-groomed.   Head:  Normocephalic/atraumatic Eyes:  fundi examined but not visualized Neck: supple, no paraspinal tenderness, full range of motion Back: No paraspinal tenderness Heart: regular rate and rhythm Lungs: Clear to auscultation bilaterally. Vascular: No carotid bruits. Neurological Exam: Mental status: alert and  oriented to person, place, and time, speech fluent and not dysarthric, language intact. Cranial nerves: CN I: not tested CN II: pupils equal, round and reactive to light, visual fields intact CN III, IV, VI:  full range of motion, no nystagmus, no ptosis CN V: facial sensation intact. CN VII: upper and lower face symmetric CN VIII: hearing intact CN IX, X: gag intact, uvula midline CN XI: sternocleidomastoid and trapezius muscles intact CN XII: tongue midline Bulk & Tone: normal, no fasciculations. Motor:  muscle strength 5/5 throughout Sensation:  Pinprick, temperature and vibratory sensation intact. Deep Tendon Reflexes:  2+ throughout,  toes downgoing.   Finger to nose testing:  Without dysmetria.   Heel to shin:  Without dysmetria.   Gait:  Normal station and stride.  Romberg negative.  Thank you for allowing me to take part in the care of this patient.  Shon Millet, DO  CC: ***

## 2023-06-05 ENCOUNTER — Ambulatory Visit: Payer: Medicaid Other | Admitting: Neurology

## 2023-06-07 DIAGNOSIS — R7303 Prediabetes: Secondary | ICD-10-CM | POA: Diagnosis not present

## 2023-06-10 DIAGNOSIS — F1120 Opioid dependence, uncomplicated: Secondary | ICD-10-CM | POA: Diagnosis not present

## 2023-06-17 DIAGNOSIS — F1120 Opioid dependence, uncomplicated: Secondary | ICD-10-CM | POA: Diagnosis not present

## 2023-06-24 DIAGNOSIS — F1120 Opioid dependence, uncomplicated: Secondary | ICD-10-CM | POA: Diagnosis not present

## 2023-07-01 DIAGNOSIS — F1120 Opioid dependence, uncomplicated: Secondary | ICD-10-CM | POA: Diagnosis not present

## 2023-07-08 DIAGNOSIS — E1165 Type 2 diabetes mellitus with hyperglycemia: Secondary | ICD-10-CM | POA: Diagnosis not present

## 2023-07-08 DIAGNOSIS — F1120 Opioid dependence, uncomplicated: Secondary | ICD-10-CM | POA: Diagnosis not present

## 2023-07-12 DIAGNOSIS — F1120 Opioid dependence, uncomplicated: Secondary | ICD-10-CM | POA: Diagnosis not present

## 2023-07-15 DIAGNOSIS — F1120 Opioid dependence, uncomplicated: Secondary | ICD-10-CM | POA: Diagnosis not present

## 2023-07-22 DIAGNOSIS — F1120 Opioid dependence, uncomplicated: Secondary | ICD-10-CM | POA: Diagnosis not present

## 2023-07-29 DIAGNOSIS — F1120 Opioid dependence, uncomplicated: Secondary | ICD-10-CM | POA: Diagnosis not present

## 2023-08-05 DIAGNOSIS — F1120 Opioid dependence, uncomplicated: Secondary | ICD-10-CM | POA: Diagnosis not present

## 2023-08-19 DIAGNOSIS — F1120 Opioid dependence, uncomplicated: Secondary | ICD-10-CM | POA: Diagnosis not present

## 2023-08-26 DIAGNOSIS — Z809 Family history of malignant neoplasm, unspecified: Secondary | ICD-10-CM | POA: Diagnosis not present

## 2023-08-26 DIAGNOSIS — I10 Essential (primary) hypertension: Secondary | ICD-10-CM | POA: Diagnosis not present

## 2023-08-26 DIAGNOSIS — G4700 Insomnia, unspecified: Secondary | ICD-10-CM | POA: Diagnosis not present

## 2023-08-26 DIAGNOSIS — M1990 Unspecified osteoarthritis, unspecified site: Secondary | ICD-10-CM | POA: Diagnosis not present

## 2023-08-26 DIAGNOSIS — Z833 Family history of diabetes mellitus: Secondary | ICD-10-CM | POA: Diagnosis not present

## 2023-08-26 DIAGNOSIS — Z008 Encounter for other general examination: Secondary | ICD-10-CM | POA: Diagnosis not present

## 2023-08-26 DIAGNOSIS — F1120 Opioid dependence, uncomplicated: Secondary | ICD-10-CM | POA: Diagnosis not present

## 2023-08-26 DIAGNOSIS — Z8249 Family history of ischemic heart disease and other diseases of the circulatory system: Secondary | ICD-10-CM | POA: Diagnosis not present

## 2023-08-26 DIAGNOSIS — E119 Type 2 diabetes mellitus without complications: Secondary | ICD-10-CM | POA: Diagnosis not present

## 2023-08-26 DIAGNOSIS — F319 Bipolar disorder, unspecified: Secondary | ICD-10-CM | POA: Diagnosis not present

## 2023-08-26 DIAGNOSIS — Z791 Long term (current) use of non-steroidal anti-inflammatories (NSAID): Secondary | ICD-10-CM | POA: Diagnosis not present

## 2023-08-26 DIAGNOSIS — E785 Hyperlipidemia, unspecified: Secondary | ICD-10-CM | POA: Diagnosis not present

## 2023-08-26 DIAGNOSIS — Z720 Tobacco use: Secondary | ICD-10-CM | POA: Diagnosis not present

## 2023-08-30 NOTE — Progress Notes (Deleted)
NEUROLOGY CONSULTATION NOTE  Erin Kidd MRN: [id number] DOB: 03-08-1966  Referring provider: Prudy Feeler, PA-C Primary care provider: Prudy Feeler, PA-C  Reason for consult:  migraines  Assessment/Plan:   ***   Subjective:  Erin Kidd is a 57 year old ***-handed female with DM 2, fibromyalgia, PTSD, Bipolar 1 disorder, anxiety and arthritis who presents for migraines.  History supplemented by referring provider's note.  ***.  She saw ENT.  CT sinuses revealed no evidence of sinus disease.    Remote CT head from 06/16/2004 personally reviewed was negative.    Past NSAIDS/analgesics:  etodolac, naproxen Past abortive triptans:  none Past abortive ergotamine:  none Past muscle relaxants:  *** Past anti-emetic:  *** Past antihypertensive medications:  HCTZ Past antidepressant medications:  *** Past anticonvulsant medications:  Lyrica Past anti-CGRP:  none Past vitamins/Herbal/Supplements:  none Past antihistamines/decongestants:  *** Other past therapies:  ***  Current NSAIDS/analgesics:  meloxicam 15mg  Current triptans:  none Current ergotamine:  none Current anti-emetic:  none Current muscle relaxants:  tizanidine 4mg  Current Antihypertensive medications:  lisinopril 10mg , prazosin Current Antidepressant medications:  none Current Anticonvulsant medications:  lamotrigine 100mg  *** Current anti-CGRP:  none Current Vitamins/Herbal/Supplements:  none Current Antihistamines/Decongestants:  Flonase Other therapy:  *** Other medications:  Ambien, Amitiza   Caffeine:  *** Alcohol:  *** Smoker:  *** Diet:  *** Exercise:  *** Depression:  ***; Anxiety:  *** Other pain:  *** Sleep hygiene:  *** Family history of headache:  ***      PAST MEDICAL HISTORY: Past Medical History:  Diagnosis Date   Anxiety    Arthritis    Bipolar 1 disorder (HCC)    Bursitis    Chronic pain    Diabetes mellitus without complication (HCC)    Eczema    Fibromyalgia    Herniated  disc    PTSD (post-traumatic stress disorder)     PAST SURGICAL HISTORY: Past Surgical History:  Procedure Laterality Date   BACK SURGERY     CERVICAL SPINE SURGERY     CESAREAN SECTION     HERNIA REPAIR     TONSILLECTOMY      MEDICATIONS: Current Outpatient Medications on File Prior to Visit  Medication Sig Dispense Refill   acetaminophen (TYLENOL) 500 MG tablet Take 1,000 mg by mouth every 6 (six) hours as needed for moderate pain.     aspirin 81 MG chewable tablet Chew 1 tablet by mouth daily.     atorvastatin (LIPITOR) 40 MG tablet Take 1 tablet by mouth daily.     azelastine (ASTELIN) 0.1 % nasal spray Place 1-2 sprays into both nostrils 2 (two) times daily as needed (runny nose/drainage). Use in each nostril as directed 30 mL 5   calcium carbonate (OSCAL) 1500 (600 Ca) MG TABS tablet Take 1 tablet by mouth daily.     Cholecalciferol 250 MCG (10000 UT) CAPS Take by mouth.     dicyclomine (BENTYL) 10 MG capsule Take 10 mg by mouth 4 (four) times daily -  before meals and at bedtime.     dicyclomine (BENTYL) 20 MG tablet TAKE 1/2 TABLET BY MOUTH FOUR TIMES A DAY     Empagliflozin-metFORMIN HCl (SYNJARDY) 12.5-500 MG TABS Take 1 tablet by mouth 2 (two) times daily.     fexofenadine (ALLEGRA) 180 MG tablet Take 1 tablet by mouth daily.     fluticasone (FLONASE) 50 MCG/ACT nasal spray Place 1 spray into both nostrils 2 (two) times daily as needed (nasal congestion). 16 g  5   glucose blood (PRECISION QID TEST) test strip 1 Strip by external route every day     hydrochlorothiazide (MICROZIDE) 12.5 MG capsule Take 1 capsule (12.5 mg total) by mouth daily. 30 capsule 4   hydrOXYzine (ATARAX/VISTARIL) 25 MG tablet Take 1 tablet (25 mg total) by mouth every 6 (six) hours. 20 tablet 0   lamoTRIgine (LAMICTAL) 100 MG tablet Take 1 tablet by mouth daily.     lamoTRIgine (LAMICTAL) 25 MG tablet Take 25 mg by mouth daily.     lisinopril (PRINIVIL,ZESTRIL) 5 MG tablet Take 1 tablet by mouth  daily.  11   lisinopril-hydrochlorothiazide (ZESTORETIC) 10-12.5 MG tablet Take 1 tablet by mouth daily.     LYRICA 150 MG capsule Take 1 capsule by mouth 2 (two) times daily.     meloxicam (MOBIC) 15 MG tablet Take 15 mg by mouth daily.     montelukast (SINGULAIR) 10 MG tablet Take 10 mg by mouth at bedtime.     oxyCODONE-acetaminophen (PERCOCET) 10-325 MG tablet Take 1 tablet by mouth every 4 (four) hours as needed for pain.     permethrin (ELIMITE) 5 % cream Apply to affected area once 60 g 1   prazosin (MINIPRESS) 1 MG capsule Take 1 mg by mouth at bedtime.     prazosin (MINIPRESS) 1 MG capsule Take 1 capsule by mouth every evening.     Skin Protectants, Misc. (EUCERIN) cream Apply topically as needed for wound care. 397 g 0   tiZANidine (ZANAFLEX) 4 MG tablet Take 4 mg by mouth every 6 (six) hours as needed for muscle spasms.     tiZANidine (ZANAFLEX) 4 MG tablet Take 1 tablet by mouth 2 (two) times daily.     zolpidem (AMBIEN CR) 12.5 MG CR tablet Take 1 tablet by mouth at bedtime.     zolpidem (AMBIEN) 10 MG tablet TAKE ONE TABLET BY MOUTH EVERY NIGHT AT BEDTIME AS NEEDED FOR SLEEP     No current facility-administered medications on file prior to visit.    ALLERGIES: Allergies  Allergen Reactions   Hydrocodone Hives and Nausea And Vomiting    FAMILY HISTORY: Family History  Problem Relation Age of Onset   Asthma Brother     Objective:  *** General: No acute distress.  Patient appears well-groomed.   Head:  Normocephalic/atraumatic Eyes:  fundi examined but not visualized Neck: supple, no paraspinal tenderness, full range of motion Back: No paraspinal tenderness Heart: regular rate and rhythm Lungs: Clear to auscultation bilaterally. Vascular: No carotid bruits. Neurological Exam: Mental status: alert and oriented to person, place, and time, speech fluent and not dysarthric, language intact. Cranial nerves: CN I: not tested CN II: pupils equal, round and reactive to  light, visual fields intact CN III, IV, VI:  full range of motion, no nystagmus, no ptosis CN V: facial sensation intact. CN VII: upper and lower face symmetric CN VIII: hearing intact CN IX, X: gag intact, uvula midline CN XI: sternocleidomastoid and trapezius muscles intact CN XII: tongue midline Bulk & Tone: normal, no fasciculations. Motor:  muscle strength 5/5 throughout Sensation:  Pinprick, temperature and vibratory sensation intact. Deep Tendon Reflexes:  2+ throughout,  toes downgoing.   Finger to nose testing:  Without dysmetria.   Heel to shin:  Without dysmetria.   Gait:  Normal station and stride.  Romberg negative.    Thank you for allowing me to take part in the care of this patient.  Shon Millet, DO  CC: ***

## 2023-09-02 ENCOUNTER — Encounter: Payer: Self-pay | Admitting: Neurology

## 2023-09-02 ENCOUNTER — Ambulatory Visit: Payer: 59 | Admitting: Neurology

## 2023-09-02 DIAGNOSIS — F1120 Opioid dependence, uncomplicated: Secondary | ICD-10-CM | POA: Diagnosis not present

## 2023-09-09 DIAGNOSIS — F1120 Opioid dependence, uncomplicated: Secondary | ICD-10-CM | POA: Diagnosis not present

## 2023-09-16 DIAGNOSIS — F1120 Opioid dependence, uncomplicated: Secondary | ICD-10-CM | POA: Diagnosis not present

## 2023-09-23 DIAGNOSIS — F1120 Opioid dependence, uncomplicated: Secondary | ICD-10-CM | POA: Diagnosis not present

## 2023-09-30 DIAGNOSIS — F1120 Opioid dependence, uncomplicated: Secondary | ICD-10-CM | POA: Diagnosis not present

## 2023-10-07 DIAGNOSIS — F1120 Opioid dependence, uncomplicated: Secondary | ICD-10-CM | POA: Diagnosis not present

## 2023-10-14 DIAGNOSIS — F1120 Opioid dependence, uncomplicated: Secondary | ICD-10-CM | POA: Diagnosis not present

## 2023-10-21 DIAGNOSIS — F1120 Opioid dependence, uncomplicated: Secondary | ICD-10-CM | POA: Diagnosis not present
